# Patient Record
Sex: Female | Born: 1950 | Race: Black or African American | Hispanic: No | Smoking: Current every day smoker
Health system: Southern US, Community
[De-identification: ages and names within clinical notes are randomized; demographics above are authoritative.]

## PROBLEM LIST (undated history)

## (undated) DIAGNOSIS — D171 Benign lipomatous neoplasm of skin and subcutaneous tissue of trunk: Secondary | ICD-10-CM

## (undated) DIAGNOSIS — F32A Depression, unspecified: Secondary | ICD-10-CM

## (undated) DIAGNOSIS — F329 Major depressive disorder, single episode, unspecified: Secondary | ICD-10-CM

## (undated) DIAGNOSIS — J432 Centrilobular emphysema: Secondary | ICD-10-CM

## (undated) DIAGNOSIS — I10 Essential (primary) hypertension: Secondary | ICD-10-CM

## (undated) DIAGNOSIS — K219 Gastro-esophageal reflux disease without esophagitis: Secondary | ICD-10-CM

## (undated) DIAGNOSIS — F419 Anxiety disorder, unspecified: Secondary | ICD-10-CM

## (undated) DIAGNOSIS — E785 Hyperlipidemia, unspecified: Secondary | ICD-10-CM

## (undated) DIAGNOSIS — R002 Palpitations: Secondary | ICD-10-CM

## (undated) DIAGNOSIS — E119 Type 2 diabetes mellitus without complications: Secondary | ICD-10-CM

## (undated) HISTORY — DX: Essential (primary) hypertension: I10

## (undated) HISTORY — PX: COLONOSCOPY: SHX174

## (undated) HISTORY — DX: Gastro-esophageal reflux disease without esophagitis: K21.9

## (undated) HISTORY — PX: CARDIAC CATHETERIZATION: SHX172

## (undated) HISTORY — DX: Hyperlipidemia, unspecified: E78.5

---

## 2003-01-27 ENCOUNTER — Other Ambulatory Visit: Payer: Self-pay

## 2004-08-31 ENCOUNTER — Ambulatory Visit: Payer: Self-pay | Admitting: Internal Medicine

## 2004-10-24 ENCOUNTER — Ambulatory Visit: Payer: Self-pay | Admitting: Gastroenterology

## 2005-04-23 ENCOUNTER — Emergency Department: Payer: Self-pay | Admitting: Emergency Medicine

## 2005-04-23 ENCOUNTER — Other Ambulatory Visit: Payer: Self-pay

## 2005-08-09 ENCOUNTER — Ambulatory Visit: Payer: Self-pay | Admitting: Family Medicine

## 2006-01-07 ENCOUNTER — Other Ambulatory Visit: Payer: Self-pay

## 2006-01-07 ENCOUNTER — Inpatient Hospital Stay: Payer: Self-pay | Admitting: Internal Medicine

## 2006-04-18 ENCOUNTER — Emergency Department: Payer: Self-pay | Admitting: Emergency Medicine

## 2006-09-01 ENCOUNTER — Other Ambulatory Visit: Payer: Self-pay

## 2006-09-01 ENCOUNTER — Emergency Department: Payer: Self-pay | Admitting: Internal Medicine

## 2007-02-01 ENCOUNTER — Emergency Department: Payer: Self-pay | Admitting: Emergency Medicine

## 2007-06-04 ENCOUNTER — Ambulatory Visit: Payer: Self-pay

## 2008-06-01 ENCOUNTER — Emergency Department: Payer: Self-pay | Admitting: Emergency Medicine

## 2009-03-07 ENCOUNTER — Emergency Department: Payer: Self-pay | Admitting: Emergency Medicine

## 2012-02-17 ENCOUNTER — Emergency Department: Payer: Self-pay | Admitting: Emergency Medicine

## 2012-02-17 LAB — CBC
HCT: 39.5 % (ref 35.0–47.0)
HGB: 13.2 g/dL (ref 12.0–16.0)
MCH: 31.6 pg (ref 26.0–34.0)
MCHC: 33.4 g/dL (ref 32.0–36.0)
WBC: 7.7 10*3/uL (ref 3.6–11.0)

## 2012-02-17 LAB — TROPONIN I: Troponin-I: <0.02 ng/mL

## 2012-02-17 LAB — COMPREHENSIVE METABOLIC PANEL
Albumin: 3.8 g/dL (ref 3.4–5.0)
Alkaline Phosphatase: 122 U/L (ref 50–136)
Anion Gap: 10 (ref 7–16)
BUN: 19 mg/dL — ABNORMAL HIGH (ref 7–18)
Bilirubin,Total: 0.3 mg/dL (ref 0.2–1.0)
Co2: 26 mmol/L (ref 21–32)
Creatinine: 0.81 mg/dL (ref 0.60–1.30)
EGFR (Non-African Amer.): >60
Glucose: 124 mg/dL — ABNORMAL HIGH (ref 65–99)
Osmolality: 283 (ref 275–301)
Potassium: 3.6 mmol/L (ref 3.5–5.1)

## 2012-02-17 LAB — PRO B NATRIURETIC PEPTIDE: B-Type Natriuretic Peptide: 29 pg/mL (ref 0–125)

## 2012-12-20 ENCOUNTER — Emergency Department: Payer: Self-pay | Admitting: Emergency Medicine

## 2015-03-02 ENCOUNTER — Emergency Department: Payer: Self-pay

## 2015-03-02 ENCOUNTER — Encounter: Payer: Self-pay | Admitting: Emergency Medicine

## 2015-03-02 ENCOUNTER — Emergency Department
Admission: EM | Admit: 2015-03-02 | Discharge: 2015-03-02 | Disposition: A | Discharge status: Home / Self Care | Payer: Self-pay | Attending: Emergency Medicine | Admitting: Emergency Medicine

## 2015-03-02 DIAGNOSIS — R519 Headache, unspecified: Secondary | ICD-10-CM

## 2015-03-02 DIAGNOSIS — F17200 Nicotine dependence, unspecified, uncomplicated: Secondary | ICD-10-CM | POA: Insufficient documentation

## 2015-03-02 DIAGNOSIS — R42 Dizziness and giddiness: Secondary | ICD-10-CM

## 2015-03-02 DIAGNOSIS — I10 Essential (primary) hypertension: Secondary | ICD-10-CM | POA: Insufficient documentation

## 2015-03-02 DIAGNOSIS — R110 Nausea: Secondary | ICD-10-CM

## 2015-03-02 DIAGNOSIS — R51 Headache: Secondary | ICD-10-CM

## 2015-03-02 DIAGNOSIS — E876 Hypokalemia: Secondary | ICD-10-CM | POA: Insufficient documentation

## 2015-03-02 LAB — BASIC METABOLIC PANEL
ANION GAP: 7 (ref 5–15)
BUN: 11 mg/dL (ref 6–20)
CHLORIDE: 105 mmol/L (ref 101–111)
CO2: 31 mmol/L (ref 22–32)
CREATININE: 0.87 mg/dL (ref 0.44–1.00)
Calcium: 8.9 mg/dL (ref 8.9–10.3)
GFR calc non Af Amer: >60 mL/min (ref >60)
Glucose, Bld: 102 mg/dL — ABNORMAL HIGH (ref 65–99)
Potassium: 3.1 mmol/L — ABNORMAL LOW (ref 3.5–5.1)
Sodium: 143 mmol/L (ref 135–145)

## 2015-03-02 LAB — CBC
HEMATOCRIT: 39.2 % (ref 35.0–47.0)
HEMOGLOBIN: 13.3 g/dL (ref 12.0–16.0)
MCH: 32.1 pg (ref 26.0–34.0)
MCHC: 34 g/dL (ref 32.0–36.0)
MCV: 94.7 fL (ref 80.0–100.0)
Platelets: 169 10*3/uL (ref 150–440)
RBC: 4.14 MIL/uL (ref 3.80–5.20)
RDW: 13.8 % (ref 11.5–14.5)
WBC: 7.9 10*3/uL (ref 3.6–11.0)

## 2015-03-02 MED ORDER — POTASSIUM CHLORIDE CRYS ER 20 MEQ PO TBCR
40.0000 meq | EXTENDED_RELEASE_TABLET | Freq: Once | ORAL | Status: AC
Start: 2015-03-02 — End: 2015-03-02
  Administered 2015-03-02: 40 meq via ORAL
  Filled 2015-03-02: qty 2

## 2015-03-02 MED ORDER — HYDROCHLOROTHIAZIDE 12.5 MG PO TABS: 25.0000 mg | ORAL_TABLET | Freq: Every day | ORAL | Status: DC

## 2015-03-02 MED ORDER — ONDANSETRON HCL 4 MG/2ML IJ SOLN
4.0000 mg | Freq: Once | INTRAMUSCULAR | Status: AC
  Administered 2015-03-02: 4 mg via INTRAVENOUS
  Filled 2015-03-02: qty 2

## 2015-03-02 MED ORDER — LABETALOL HCL 5 MG/ML IV SOLN
10.0000 mg | Freq: Once | INTRAVENOUS | Status: AC
  Administered 2015-03-02: 10 mg via INTRAVENOUS
  Filled 2015-03-02: qty 4

## 2015-03-02 NOTE — ED Notes (Signed)
Pt here for dizziness, headache and nausea.

## 2015-03-02 NOTE — Discharge Instructions (Signed)
Please take the blood pressure medication every day in the morning. Please check your blood pressure approximate 1 hour after you take the medication, and during a calm part of your day. Record your blood pressure and bring it with you to your follow-up appointment.  Please make an appointment to establish a primary care physician at the [facility] clinic or at any primary care clinic of your choice.  Return to the emergency department if he develops severe headache, nausea or vomiting, visual or speech changes, confusion, numbness tingling or weakness, difficulty walking, chest pain or shortness of breath, or any other symptoms concerning to you.

## 2015-03-02 NOTE — ED Notes (Signed)
Pt being transported to CT at this time, will start IV and administer meds once pt returns from CT. Pt and family made aware, verbalized understanding at this time

## 2015-03-02 NOTE — ED Notes (Signed)
Pt given urine cup, instructed on proper use, verbalized to let RN know once able to provide sample.

## 2015-03-02 NOTE — ED Provider Notes (Signed)
[facility] Emergency Department Provider Note  ____________________________________________  Time seen: Approximately 5:43 PM  I have reviewed the triage vital signs and the nursing notes.   HISTORY  Chief Complaint Dizziness; Nausea; and Hypertension    HPI Tami Sanders is a 65 y.o. female with a history of untreated hypertension due to medication noncompliance brought by her daughter for mild headache, postural lightheadedness, and nausea without vomiting. Patient reports that she sat last saw a physician greater than 5 years ago but that she does not take her prescribed antihypertensives. Over the past 2 days, she has had a mild frontal headache associated with lightheadedness when she's "stands up too fast" and nausea without vomiting. She is still tolerating her normal amount of food and drink. She does not have any fever, cough or cold symptoms, chest pain, pressure or tightness, palpitations, numbness tingling or weakness.   No past medical history on file.  There are no active problems to display for this patient.   No past surgical history on file.  Current Outpatient Rx  Name  Route  Sig  Dispense  Refill  . hydrochlorothiazide (HYDRODIURIL) 12.5 MG tablet   Oral   Take 2 tablets (25 mg total) by mouth daily.   30 tablet   0     Allergies Review of patient's allergies indicates no known allergies.  No family history on file.  Social History Social History  Substance Use Topics  . Smoking status: Current Every Day Smoker  . Smokeless tobacco: None  . Alcohol Use: None    Review of Systems Constitutional: No fever/chills. + Postural Lightheadedness, no syncope. Eyes: No visual changes. No blurred or double vision. ENT: No sore throat. Cardiovascular: Denies chest pain, palpitations. Respiratory: Denies shortness of breath.  No cough. Gastrointestinal: No abdominal pain.  Positive nausea, no vomiting.  No diarrhea.  No  constipation. Genitourinary: Negative for dysuria. Musculoskeletal: Negative for back pain. Skin: Negative for rash. Neurological: Positive mild headaches without focal weakness or numbness. The tingling. No difficulty walking. No visual or speech changes. No changes in mental status.  10-point ROS otherwise negative.  ____________________________________________   PHYSICAL EXAM:  VITAL SIGNS: ED Triage Vitals  Enc Vitals Group     BP 03/02/15 1654 237/108 mmHg     Pulse Rate 03/02/15 1654 80     Resp 03/02/15 1654 16     Temp 03/02/15 1654 98.5 F (36.9 C)     Temp Source 03/02/15 1654 Oral     SpO2 03/02/15 1654 100 %     Weight --      Height --      Head Cir --      Peak Flow --      Pain Score 03/02/15 1654 3     Pain Loc --      Pain Edu? --      Excl. in [place]? --     Constitutional: Alert and oriented. Well appearing and in no acute distress. Answer question appropriately. Eyes: Conjunctivae are normal.  EOMI. PERRLA.  Head: Atraumatic. Nose: No congestion/rhinnorhea. Mouth/Throat: Mucous membranes are moist.  Neck: No stridor.  Supple.  No JVD Cardiovascular: Normal rate, regular rhythm. No murmurs, rubs or gallops.  Respiratory: Normal respiratory effort.  No retractions. Lungs CTAB.  No wheezes, rales or ronchi. Gastrointestinal: Soft and nontender. No distention. No peritoneal signs. Musculoskeletal: No LE edema. No palpable cords or tenderness to palpation in the calves. Negative Homans sign. Neurologic:  Alert and  oriented 3. Speech is clear. Face and smile are symmetric. EOMI and PERRLA. No pronator drift. Moves all extremity's well. Normal gait without ataxia. Skin:  Skin is warm, dry and intact. No rash noted. Psychiatric: Mood and affect are normal. Speech and behavior are normal.  Normal judgement.  ____________________________________________   LABS (all labs ordered are listed, but only abnormal results are displayed)  Labs Reviewed  BASIC  METABOLIC PANEL - Abnormal; Notable for the following:    Potassium 3.1 (*)    Glucose, Bld 102 (*)    All other components within normal limits  CBC  URINALYSIS COMPLETEWITH MICROSCOPIC ([facility] ONLY)   ____________________________________________  EKG  ED ECG REPORT I, Eula Listen, the attending physician, personally viewed and interpreted this ECG.   Date: 03/02/2015  EKG Time: 1659  Rate: 72  Rhythm: normal sinus rhythm  Axis: Normal  Intervals:none  ST&T Change: No ST elevation. No ischemic changes.  ____________________________________________  RADIOLOGY  Ct Head Wo Contrast  03/02/2015  CLINICAL DATA:  Pt. C/o dizziness and nausea with HTN x2 days. No known injuries. No hx of CA or surg. EXAM: CT HEAD WITHOUT CONTRAST TECHNIQUE: Contiguous axial images were obtained from the base of the skull through the vertex without intravenous contrast. COMPARISON:  03/07/2009 FINDINGS: The ventricles are normal in size and configuration. There are no parenchymal masses or mass effect, no evidence of an infarct, no extra-axial masses or abnormal fluid collections and no intracranial hemorrhage. No skull lesion. Visualized sinuses and mastoid air cells are clear. IMPRESSION: Normal unenhanced CT scan the brain. Electronically Signed   By: Lajean Manes M.D.   On: 03/02/2015 18:07    ____________________________________________   PROCEDURES  Procedure(s) performed: None  Critical Care performed: No ____________________________________________   INITIAL IMPRESSION / ASSESSMENT AND PLAN / ED COURSE  Pertinent labs & imaging results that were available during my care of the patient were reviewed by me and considered in my medical decision making (see chart for details).  65 y.o. female with untreated HTN presenting w/ 2d of mild HA, postural lightheaded and nausea w/o vomiting w/ BP 237/108 on arrival.  Patient has no focal findings consistent with acute CVA on exam. She may  have symptomatic hypertension, so I will attempt to lower her blood pressure and reevaluate for improvement of symptoms. We'll also get a CT scan to rule out stroke although this is less likely. We will also check her kidney function and she has had many years of untreated hypertension. If her workup in the emergency department does not use any acute emergency, will plan to discharge her home with antihypertensives and close PMD follow-up.  ----------------------------------------- 6:33 PM on 03/02/2015 -----------------------------------------  The patient's repeat blood pressure is 191/78. Her headache is improving and her nausea is gone. She is tolerating liquid by mouth. Her labs are reassuring with a normal creatinine and normal blood count she also has a CT of the head which does not show any acute process. She is unable to give urine at this time. I'll plan to discharge her home with instructions to follow up at the [place] clinic, or the [facility] where she's been seen before. She understands return precautions as well as follow-up instructions. ____________________________________________  FINAL CLINICAL IMPRESSION(S) / ED DIAGNOSES  Final diagnoses:  Essential hypertension  Acute nonintractable headache, unspecified headache type  Lightheadedness  Nausea without vomiting  Hypokalemia      NEW MEDICATIONS STARTED DURING THIS VISIT:  New Prescriptions   HYDROCHLOROTHIAZIDE (  HYDRODIURIL) 12.5 MG TABLET    Take 2 tablets (25 mg total) by mouth daily.     Eula Listen, MD 03/02/15 [phone]

## 2016-07-30 ENCOUNTER — Emergency Department: Payer: Medicare HMO

## 2016-07-30 ENCOUNTER — Emergency Department
Admission: EM | Admit: 2016-07-30 | Discharge: 2016-07-30 | Disposition: A | Discharge status: Home / Self Care | Payer: Medicare HMO | Attending: Emergency Medicine | Admitting: Emergency Medicine

## 2016-07-30 DIAGNOSIS — E876 Hypokalemia: Secondary | ICD-10-CM

## 2016-07-30 DIAGNOSIS — R079 Chest pain, unspecified: Secondary | ICD-10-CM | POA: Diagnosis not present

## 2016-07-30 DIAGNOSIS — Z79899 Other long term (current) drug therapy: Secondary | ICD-10-CM | POA: Diagnosis not present

## 2016-07-30 DIAGNOSIS — I10 Essential (primary) hypertension: Secondary | ICD-10-CM | POA: Diagnosis not present

## 2016-07-30 DIAGNOSIS — F17210 Nicotine dependence, cigarettes, uncomplicated: Secondary | ICD-10-CM | POA: Diagnosis not present

## 2016-07-30 LAB — BASIC METABOLIC PANEL
Anion gap: 8 (ref 5–15)
BUN: 19 mg/dL (ref 6–20)
CALCIUM: 9.5 mg/dL (ref 8.9–10.3)
CO2: 29 mmol/L (ref 22–32)
CREATININE: 0.99 mg/dL (ref 0.44–1.00)
Chloride: 103 mmol/L (ref 101–111)
GFR calc non Af Amer: 59 mL/min — ABNORMAL LOW (ref >60)
GLUCOSE: 152 mg/dL — AB (ref 65–99)
Potassium: 3.2 mmol/L — ABNORMAL LOW (ref 3.5–5.1)
Sodium: 140 mmol/L (ref 135–145)

## 2016-07-30 LAB — CBC
HCT: 41.4 % (ref 35.0–47.0)
Hemoglobin: 13.9 g/dL (ref 12.0–16.0)
MCH: 31.4 pg (ref 26.0–34.0)
MCHC: 33.7 g/dL (ref 32.0–36.0)
MCV: 93.4 fL (ref 80.0–100.0)
PLATELETS: 229 10*3/uL (ref 150–440)
RBC: 4.43 MIL/uL (ref 3.80–5.20)
RDW: 14.3 % (ref 11.5–14.5)
WBC: 8.4 10*3/uL (ref 3.6–11.0)

## 2016-07-30 LAB — TROPONIN I: Troponin I: <0.03 ng/mL (ref <0.03)

## 2016-07-30 MED ORDER — POTASSIUM CHLORIDE CRYS ER 20 MEQ PO TBCR
40.0000 meq | EXTENDED_RELEASE_TABLET | Freq: Once | ORAL | Status: AC
  Administered 2016-07-30: 40 meq via ORAL
  Filled 2016-07-30: qty 2

## 2016-07-30 NOTE — ED Triage Notes (Signed)
Pt c/o substernal chest pain/tightness since yesterday with increased SOB.. Pt is in NAD at present. Respirations WNL.. Skin is warm and dry.Marland Kitchen

## 2016-07-30 NOTE — ED Notes (Signed)
Dr. Lord at bedside at this time.  

## 2016-07-30 NOTE — ED Provider Notes (Signed)
[facility] Emergency Department Provider Note ____________________________________________   I have reviewed the triage vital signs and the triage nursing note.  HISTORY  Chief Complaint Chest Pain   Historian Patient  HPI Tami Sanders is a 66 y.o. female presents here with her daughter, reports feeling of off and on her chest for about 3 or 4 days. She describes it as a mild central chest discomfort, mild shortness of breath. No nausea or sweats. No dizziness or passing out. No focal weakness or numbness.  She is a smoker. She is diagnosed with high blood pressure and follows with Dr. Lennox Grumbles at the [place] clinic.  No new medication or medication changes recently.  No exertional component has been constant for 3 or 4 days. Symptoms are overall mild. Next the next States that she had a cardiac catheterization about 2 years ago and was told there was no blockages.      History reviewed. No pertinent past medical history.  There are no active problems to display for this patient.   History reviewed. No pertinent surgical history.  Prior to Admission medications   Medication Sig Start Date End Date Taking? Authorizing Provider  hydrochlorothiazide (HYDRODIURIL) 12.5 MG tablet Take 2 tablets (25 mg total) by mouth daily. 03/02/15   Eula Listen, MD    No Known Allergies  No family history on file.  Social History Social History  Substance Use Topics  . Smoking status: Current Every Day Smoker  . Smokeless tobacco: Never Used  . Alcohol use No    Review of Systems  Constitutional: Negative for fever. Eyes: Negative for visual changes. ENT: Negative for sore throat. Cardiovascular: Positive for mild central chest discomfort. Respiratory: Positive for mild shortness of breath, no pleuritic chest discomfort. Gastrointestinal: Negative for abdominal pain, vomiting and diarrhea. Genitourinary: Negative for dysuria. Musculoskeletal:  Negative for back pain. Skin: Negative for rash. Neurological: Negative for headache.  ____________________________________________   PHYSICAL EXAM:  VITAL SIGNS: ED Triage Vitals  Enc Vitals Group     BP 07/30/16 1611 (!) 166/81     Pulse Rate 07/30/16 1611 (!) 102     Resp 07/30/16 1611 20     Temp 07/30/16 1611 98.8 F (37.1 C)     Temp Source 07/30/16 1611 Oral     SpO2 07/30/16 1611 96 %     Weight 07/30/16 1612 200 lb (90.7 kg)     Height 07/30/16 1612 5\' 6"  (1.676 m)     Head Circumference --      Peak Flow --      Pain Score 07/30/16 1608 7     Pain Loc --      Pain Edu? --      Excl. in [place]? --      Constitutional: Alert and oriented. Well appearing and in no distress. HEENT   Head: Normocephalic and atraumatic.      Eyes: Conjunctivae are normal. Pupils equal and round.       Ears:         Nose: No congestion/rhinnorhea.   Mouth/Throat: Mucous membranes are moist.   Neck: No stridor. Cardiovascular/Chest: Normal rate, regular rhythm.  No murmurs, rubs, or gallops. Respiratory: Normal respiratory effort without tachypnea nor retractions. Breath sounds are clear and equal bilaterally. No wheezes/rales/rhonchi. Gastrointestinal: Soft. No distention, no guarding, no rebound. Nontender.  Obese  Genitourinary/rectal:Deferred Musculoskeletal: Nontender with normal range of motion in all extremities. No joint effusions.  No lower extremity tenderness.  No edema. Neurologic:  Normal speech and language. No gross or focal neurologic deficits are appreciated. Skin:  Skin is warm, dry and intact. No rash noted. Psychiatric: Mood and affect are normal. Speech and behavior are normal. Patient exhibits appropriate insight and judgment.   ____________________________________________  LABS (pertinent positives/negatives)  Labs Reviewed  BASIC METABOLIC PANEL - Abnormal; Notable for the following:       Result Value   Potassium 3.2 (*)    Glucose, Bld 152 (*)     GFR calc non Af Amer 59 (*)    All other components within normal limits  CBC  TROPONIN I    ____________________________________________    EKG I, Lisa Roca, MD, the attending physician have personally viewed and interpreted all ECGs.  107 bpm. Sinus tachycardia. Narrow QRS. Normal axis.  Nonspecific ST and T-wave. ____________________________________________  RADIOLOGY All Xrays were viewed by me. Imaging interpreted by Radiologist.  Chest x-ray two-view:  IMPRESSION: No radiographic evidence of acute cardiopulmonary disease __________________________________________  PROCEDURES  Procedure(s) performed: None  Critical Care performed: None  ____________________________________________   ED COURSE / ASSESSMENT AND PLAN  Pertinent labs & imaging results that were available during my care of the patient were reviewed by me and considered in my medical decision making (see chart for details).   This was overall as well as a she's been complaining of some central chest pressure or mild experiences shortness of breath for several days now. It is not exertional. It overall mild.  EKG is without acute ischemic findings.  Laboratory studies are reassuring with negative troponin. Chest x-ray is clear. She's not reporting infectious symptoms. Symptoms do not seem consistent with PE.  She does have a long history of smoking, and I have recommended that she follow up with Dr. Raul Del with pulmonology to consider pulmonary function testing.  In terms of the chest discomfort sensation, I am recommending that she have close follow up with a cardiologist, and her daughter will call for her tomorrow.    CONSULTATIONS:  None   Patient / Family / Caregiver informed of clinical course, medical decision-making process, and agree with plan.   I discussed return precautions, follow-up instructions, and discharge instructions with patient and/or family.  Discharge Instructions :  You were evaluated for chest pressure, and although no certain cause was found, your exam and evaluation are reassuring in the emergency department today.   Return to the emergency department immediately for any worsening symptoms including chest pain, nausea, sweats, dizziness or passing out, weakness, numbness, trouble breathing, or any other symptoms concerning to you.   ___________________________________________   FINAL CLINICAL IMPRESSION(S) / ED DIAGNOSES   Final diagnoses:  Nonspecific chest pain  Hypokalemia              Note: This dictation was prepared with Dragon dictation. Any transcriptional errors that result from this process are unintentional    Lisa Roca, MD 07/30/16 [id number]

## 2016-07-30 NOTE — Discharge Instructions (Signed)
You were evaluated for chest pressure, and although no certain cause was found, your exam and evaluation are reassuring in the emergency department today.   Return to the emergency department immediately for any worsening symptoms including chest pain, nausea, sweats, dizziness or passing out, weakness, numbness, trouble breathing, or any other symptoms concerning to you.

## 2016-07-30 NOTE — ED Notes (Signed)
NAD noted at time of D/C. Pt denies questions or concerns. Pt ambulatory to the lobby at this time.  

## 2016-07-31 ENCOUNTER — Encounter: Payer: Self-pay | Admitting: *Deleted

## 2016-07-31 ENCOUNTER — Telehealth: Payer: Self-pay | Admitting: Cardiovascular Disease

## 2016-07-31 NOTE — Telephone Encounter (Signed)
Spoke with patient and she states that she is no longer having chest pain and is just tired. Confirmed her scheduled appointment for 09/20/16 with Dr. Rockey Situ. Instructed her to go back to ED if she should develop any more chest pain associated with shortness of breath, nausea, or vomiting. She verbalized understanding with no further questions at this time.

## 2016-07-31 NOTE — Telephone Encounter (Signed)
Spoke to patient about making ED fu appointment She is currently scheduled for 09/20/16 to see Dr Rockey Situ  Please advise is patient needs to be seen sooner and if so where may she be placed.

## 2016-08-08 ENCOUNTER — Inpatient Hospital Stay: Payer: Self-pay

## 2016-08-08 ENCOUNTER — Ambulatory Visit (INDEPENDENT_AMBULATORY_CARE_PROVIDER_SITE_OTHER): Payer: Medicare HMO | Admitting: General Surgery

## 2016-08-08 ENCOUNTER — Encounter: Payer: Self-pay | Admitting: General Surgery

## 2016-08-08 ENCOUNTER — Encounter: Payer: Self-pay | Admitting: *Deleted

## 2016-08-08 VITALS — BP 142/80 | HR 82 | Resp 12 | Ht 66.0 in | Wt 200.0 lb

## 2016-08-08 DIAGNOSIS — Z803 Family history of malignant neoplasm of breast: Secondary | ICD-10-CM

## 2016-08-08 DIAGNOSIS — Z1239 Encounter for other screening for malignant neoplasm of breast: Secondary | ICD-10-CM

## 2016-08-08 DIAGNOSIS — R222 Localized swelling, mass and lump, trunk: Secondary | ICD-10-CM | POA: Diagnosis not present

## 2016-08-08 DIAGNOSIS — Z1231 Encounter for screening mammogram for malignant neoplasm of breast: Secondary | ICD-10-CM | POA: Diagnosis not present

## 2016-08-08 NOTE — Patient Instructions (Addendum)
Schedule lipoma excision procedure Schedule mammogram to be completed prior to procedure  Lipoma Removal Lipoma removal is a surgical procedure to remove a noncancerous (benign) tumor that is made up of fat cells (lipoma). Most lipomas are small and painless and do not require treatment. They can form in many areas of the body but are most common under the skin of the back, shoulders, arms, and thighs. You may need lipoma removal if you have a lipoma that is large, growing, or causing discomfort. Lipoma removal may also be done for cosmetic reasons. Tell a health care provider about:  Any allergies you have.  All medicines you are taking, including vitamins, herbs, eye drops, creams, and over-the-counter medicines.  Any problems you or family members have had with anesthetic medicines.  Any blood disorders you have.  Any surgeries you have had.  Any medical conditions you have.  Whether you are pregnant or may be pregnant. What are the risks? Generally, this is a safe procedure. However, problems may occur, including:  Infection.  Bleeding.  Allergic reactions to medicines.  Damage to nerves or blood vessels near the lipoma.  Scarring.  What happens before the procedure? Staying hydrated Follow instructions from your health care provider about hydration, which may include:  Up to 2 hours before the procedure - you may continue to drink clear liquids, such as water, clear fruit juice, black coffee, and plain tea.  Eating and drinking restrictions Follow instructions from your health care provider about eating and drinking, which may include:  8 hours before the procedure - stop eating heavy meals or foods such as meat, fried foods, or fatty foods.  6 hours before the procedure - stop eating light meals or foods, such as toast or cereal.  6 hours before the procedure - stop drinking milk or drinks that contain milk.  2 hours before the procedure - stop drinking clear  liquids.  Medicines  Ask your health care provider about: ? Changing or stopping your regular medicines. This is especially important if you are taking diabetes medicines or blood thinners. ? Taking medicines such as aspirin and ibuprofen. These medicines can thin your blood. Do not take these medicines before your procedure if your health care provider instructs you not to.  You may be given antibiotic medicine to help prevent infection. General instructions  Ask your health care provider how your surgical site will be marked or identified.  You will have a physical exam. Your health care provider will check the size of the lipoma and whether it can be moved easily.  You may have imaging tests, such as: ? X-rays. ? CT scan. ? MRI.  Plan to have someone take you home from the hospital or clinic. What happens during the procedure?  To reduce your risk of infection: ? Your health care team will wash or sanitize their hands. ? Your skin will be washed with soap.  You will be given one or more of the following: ? A medicine to help you relax (sedative). ? A medicine to numb the area (local anesthetic). ? A medicine to make you fall asleep (general anesthetic). ? A medicine that is injected into an area of your body to numb everything below the injection site (regional anesthetic).  An incision will be made over the lipoma or very near the lipoma. The incision may be made in a natural skin line or crease.  Tissues, nerves, and blood vessels near the lipoma will be moved out of the  way.  The lipoma and the capsule that surrounds it will be separated from the surrounding tissues.  The lipoma will be removed.  The incision may be closed with stitches (sutures).  A bandage (dressing) will be placed over the incision. What happens after the procedure?  Do not drive for 24 hours if you received a sedative.  Your blood pressure, heart rate, breathing rate, and blood oxygen level  will be monitored until the medicines you were given have worn off. This information is not intended to replace advice given to you by your health care provider. Make sure you discuss any questions you have with your health care provider. Document Released: 03/03/2015 Document Revised: 05/26/2015 Document Reviewed: 03/03/2015 Elsevier Interactive Patient Education  [employer].

## 2016-08-08 NOTE — Progress Notes (Signed)
Patient ID: Tami Sanders, female   DOB: 1950/04/16, 66 y.o.   MRN: [id number]  Chief Complaint  Patient presents with  . Other    Chest wall mass    HPI Tami Sanders is a 66 y.o. female is here today for an evaluation for a chest wall mass. She states the mass has been present for one year. Has not noticed an increase in size. Sore at times, only hurts when she lays on it. Denies redness/discharge. The mass is located on her right lateral chest wall. HPI  Past Medical History:  Diagnosis Date  . GERD (gastroesophageal reflux disease)   . Hypertension     No past surgical history on file.  Family History  Problem Relation Age of Onset  . Cancer Mother        breast  . Cancer Father        prostate  . Cancer Paternal Aunt         breast    Social History Social History  Substance Use Topics  . Smoking status: Current Every Day Smoker  . Smokeless tobacco: Never Used  . Alcohol use No    No Known Allergies  Current Outpatient Prescriptions  Medication Sig Dispense Refill  . amLODipine (NORVASC) 10 MG tablet Take 10 mg by mouth daily.    Marland Kitchen atorvastatin (LIPITOR) 10 MG tablet Take 10 mg by mouth daily.    . hydrALAZINE (APRESOLINE) 10 MG tablet Take 10 mg by mouth 3 (three) times daily.    . hydrochlorothiazide (HYDRODIURIL) 12.5 MG tablet Take 2 tablets (25 mg total) by mouth daily. 30 tablet 0  . lisinopril (PRINIVIL,ZESTRIL) 10 MG tablet Take 10 mg by mouth daily.    . nitroGLYCERIN (NITROSTAT) 0.3 MG SL tablet Place 0.3 mg under the tongue every 5 (five) minutes as needed for chest pain.    Marland Kitchen omeprazole (PRILOSEC) 10 MG capsule Take 10 mg by mouth daily.    . traZODone (DESYREL) 100 MG tablet Take 100 mg by mouth at bedtime.     No current facility-administered medications for this visit.     Review of Systems Review of Systems  Constitutional: Negative.   Respiratory: Negative.   Cardiovascular: Negative.     Blood pressure (!) 142/80, pulse 82, resp.  rate 12, height 5\' 6"  (1.676 m), weight 200 lb (90.7 kg).  Physical Exam Physical Exam  Constitutional: She is oriented to person, place, and time. She appears well-developed and well-nourished.  Eyes: Conjunctivae are normal. No scleral icterus.  Cardiovascular: Normal rate, regular rhythm and normal heart sounds.   Pulmonary/Chest: Effort normal and breath sounds normal. Right breast exhibits no inverted nipple, no mass, no nipple discharge, no skin change and no tenderness. Left breast exhibits no inverted nipple, no mass, no nipple discharge, no skin change and no tenderness.  Abdominal: Soft. Bowel sounds are normal.    Lymphadenopathy:    She has no cervical adenopathy.    She has no axillary adenopathy.  Neurological: She is alert and oriented to person, place, and time.  Skin: Skin is warm and dry.  Psychiatric: She has a normal mood and affect.    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 Recommend removal because of location and propensity to cause pain  Option of excision in office or in SDS discussed.  Pt prefers some sort of sedation/anesthesia Family hx of breast cancer - pt is at increased risk for breast cancer, due for mammogram, last mammogram was "years ago."     Plan    Patient be be scheduled for lipoma excision as an outpatient surgery.  Will arrange for a mammogram prior to excision.      HPI, Physical Exam, Assessment and Plan have been scribed under the direction and in the presence of Mckinley Jewel, MD.  Verlene Mayer, CMA I have completed the exam and reviewed the above documentation for accuracy and completeness.  I agree with the above.  Haematologist has been used and any errors in dictation or transcription are unintentional.  Kyjuan Gause G. Jamal Collin, M.D., F.A.C.S.   Junie Panning G 08/08/2016, 11:24  AM

## 2016-08-08 NOTE — Progress Notes (Signed)
Patient is scheduled for a bilateral screening mammogram at [facility] on 08-21-16 at 3:40 pm.   This patient wishes to wait until she sees Dr. Rockey Situ on 09-20-16 before she schedules the excision of the mass from right lateral chest wall.   Patient will call the office when she is ready to proceed.

## 2016-08-21 ENCOUNTER — Ambulatory Visit
Admission: RE | Admit: 2016-08-21 | Discharge: 2016-08-21 | Disposition: A | Discharge status: Home / Self Care | Payer: Medicare HMO | Source: Ambulatory Visit | Attending: General Surgery | Admitting: General Surgery

## 2016-08-21 DIAGNOSIS — R928 Other abnormal and inconclusive findings on diagnostic imaging of breast: Secondary | ICD-10-CM | POA: Insufficient documentation

## 2016-08-21 DIAGNOSIS — Z1231 Encounter for screening mammogram for malignant neoplasm of breast: Secondary | ICD-10-CM | POA: Insufficient documentation

## 2016-08-21 DIAGNOSIS — Z1239 Encounter for other screening for malignant neoplasm of breast: Secondary | ICD-10-CM

## 2016-08-23 ENCOUNTER — Other Ambulatory Visit: Payer: Self-pay | Admitting: General Surgery

## 2016-08-23 DIAGNOSIS — N6310 Unspecified lump in the right breast, unspecified quadrant: Secondary | ICD-10-CM

## 2016-08-23 DIAGNOSIS — R928 Other abnormal and inconclusive findings on diagnostic imaging of breast: Secondary | ICD-10-CM

## 2016-09-11 ENCOUNTER — Ambulatory Visit
Admission: RE | Admit: 2016-09-11 | Discharge: 2016-09-11 | Disposition: A | Discharge status: Home / Self Care | Payer: Medicare HMO | Source: Ambulatory Visit | Attending: General Surgery | Admitting: General Surgery

## 2016-09-11 DIAGNOSIS — R928 Other abnormal and inconclusive findings on diagnostic imaging of breast: Secondary | ICD-10-CM

## 2016-09-11 DIAGNOSIS — N6310 Unspecified lump in the right breast, unspecified quadrant: Secondary | ICD-10-CM

## 2016-09-19 NOTE — Progress Notes (Addendum)
Cardiology Office Note  Date:  09/20/2016   ID:  Tami Sanders, DOB [birth year], MRN [id number]  PCP:  Tami Merles, MD   Chief Complaint  Patient presents with  . other    Pt. was at [facility] ER with chest pain. Pt. c/o chest pain, rapid pounding heart beats and shortness of breath. Meds reviewd by the pt. verbally.      HPI:  Tami Sanders is a 66 y.o. female  With history of smoking HTN Obesity Prior episodes of chest pain mild central chest discomfort,  mild shortness of breath.  cardiac catheterization 2013 She presents for follow-up of chest pain, shortness of breath, palpitations  Discussed previous records with her, request has been placed to receive cardiac catheterization from [facility] 2013. She reports at that time nose no significant coronary disease   No nausea or sweats. No dizziness or passing out. No focal weakness or numbness.  smoker. Continues to smoke, no desire to quit  Reports blood pressure typically 130 to 140 follows with Dr. Lennox Sanders at the [place] [facility].  chest wall mass right lateral chest wall, scheduled to have lipoma resected  She reports having periodic  Strong  heart beats,  seems to run fast,  lasting 15 to 20 min Worse when hot, such as over the summer  Gradually now getting better, as the weather is improving Not a major issue at this time  Stress test 2013 [facility] Cardiac cath 2013: "normal"  Recently seen in the emergency room for chest pain, Hospital records reviewed with the patient in detail Atypical in nature, workup negative  EKG personally reviewed by myself on todays visit Shows normal sinus rhythm with rate 78 bpm nonspecific T wave abnormality, rare PVCs  Lab work reviewed showing total cholesterol 208, LDL 130, normal LFTs, creatinine 0.8, glucose 109 him a hemoglobin A1c 6.4   PMH:   has a past medical history of GERD (gastroesophageal reflux disease); Hyperlipidemia; and Hypertension.  PSH:   History reviewed. No pertinent  surgical history.  Current Outpatient Prescriptions  Medication Sig Dispense Refill  . amLODipine (NORVASC) 10 MG tablet Take 10 mg by mouth daily.    . hydrALAZINE (APRESOLINE) 10 MG tablet Take 10 mg by mouth 3 (three) times daily.    . hydrochlorothiazide (HYDRODIURIL) 12.5 MG tablet Take 2 tablets (25 mg total) by mouth daily. 30 tablet 0  . lisinopril (PRINIVIL,ZESTRIL) 10 MG tablet Take 10 mg by mouth daily.    . nitroGLYCERIN (NITROSTAT) 0.3 MG SL tablet Place 0.3 mg under the tongue every 5 (five) minutes as needed for chest pain.    Marland Kitchen omeprazole (PRILOSEC) 10 MG capsule Take 10 mg by mouth daily.    . traZODone (DESYREL) 100 MG tablet Take 100 mg by mouth at bedtime.    . potassium chloride (K-DUR) 10 MEQ tablet Take 1 tablet (10 mEq total) by mouth daily. 30 tablet 11   No current facility-administered medications for this visit.      Allergies:   Patient has no known allergies.   Social History:  The patient  reports that she has been smoking Cigarettes.  She has a 21.00 pack-year smoking history. She has never used smokeless tobacco. She reports that she does not drink alcohol or use drugs.   Family History:   family history includes Breast cancer in her mother and paternal aunt; Cancer in her father, mother, and paternal aunt.    Review of Systems: Review of Systems  Constitutional: Negative.  Respiratory: Negative.   Cardiovascular: Positive for chest pain and palpitations.  Gastrointestinal: Negative.   Musculoskeletal: Negative.   Neurological: Negative.   Psychiatric/Behavioral: Negative.   All other systems reviewed and are negative.    PHYSICAL EXAM: VS:  BP (!) 150/82 (BP Location: Right Arm, Patient Position: Sitting, Cuff Size: Large)   Pulse 89   Ht 5' 6.5" (1.689 m)   Wt 199 lb 12 oz (90.6 kg)   BMI 31.76 kg/m  , BMI Body mass index is 31.76 kg/m. GEN: Well nourished, well developed, in no acute distress, obese  HEENT: normal  Neck: no JVD,  carotid bruits, or masses Cardiac: RRR; no murmurs, rubs, or gallops,no edema  Respiratory:  clear to auscultation bilaterally, normal work of breathing GI: soft, nontender, nondistended, + BS MS: no deformity or atrophy  Skin: warm and dry, no rash Neuro:  Strength and sensation are intact Psych: euthymic mood, full affect    Recent Labs: 07/30/2016: BUN 19; Creatinine, Ser 0.99; Hemoglobin 13.9; Platelets 229; Potassium 3.2; Sodium 140    Lipid Panel No results found for: CHOL, HDL, LDLCALC, TRIG    Wt Readings from Last 3 Encounters:  09/20/16 199 lb 12 oz (90.6 kg)  08/08/16 200 lb (90.7 kg)  07/30/16 200 lb (90.7 kg)       ASSESSMENT AND PLAN:  Chest pain, unspecified type - Plan: EKG 12-Lead Atypical in nature, currently feels well No active anginal symptoms We have requested previous cardiac catheterization results Palpitations Exacerbated by the heat of the summer, improved with the cooler weather Suggested if symptoms recur and she is symptomatic, we could try low-dose beta blocker His symptoms persist, we could start metoprolol  Centrilobular emphysema ([place]) Stressed importance of smoking cessation Chronic stable mild shortness of breath Also likely deconditioned  Essential hypertension Blood pressure is well controlled on today's visit. No changes made to the medications. Recommended she monitor blood pressure at home If blood pressure continues to run high we could increase hydralazine or lisinopril  Smoker Long discussion concerning need for smoking cessation We have encouraged her to continue to work on weaning her cigarettes and smoking cessation. She will continue to work on this and does not want any assistance with chantix.   Disposition:   F/U  12 months  Previous records reviewed, long discussion with patient and family in the room with her today  Total encounter time more than 60 minutes  Greater than 50% was spent in counseling and  coordination of care with the patient    Orders Placed This Encounter  Procedures  . EKG 12-Lead     Signed, Tami Sanders, M.D., Ph.D. 09/20/2016  [place], [place]

## 2016-09-20 ENCOUNTER — Ambulatory Visit (INDEPENDENT_AMBULATORY_CARE_PROVIDER_SITE_OTHER): Payer: Medicare HMO | Admitting: Cardiovascular Disease

## 2016-09-20 ENCOUNTER — Encounter: Payer: Self-pay | Admitting: Cardiovascular Disease

## 2016-09-20 VITALS — BP 150/82 | HR 89 | Ht 66.5 in | Wt 199.8 lb

## 2016-09-20 DIAGNOSIS — I10 Essential (primary) hypertension: Secondary | ICD-10-CM | POA: Diagnosis not present

## 2016-09-20 DIAGNOSIS — J432 Centrilobular emphysema: Secondary | ICD-10-CM | POA: Diagnosis not present

## 2016-09-20 DIAGNOSIS — R002 Palpitations: Secondary | ICD-10-CM | POA: Diagnosis not present

## 2016-09-20 DIAGNOSIS — R079 Chest pain, unspecified: Secondary | ICD-10-CM

## 2016-09-20 DIAGNOSIS — F17200 Nicotine dependence, unspecified, uncomplicated: Secondary | ICD-10-CM

## 2016-09-20 MED ORDER — POTASSIUM CHLORIDE ER 10 MEQ PO TBCR: 10.0000 meq | EXTENDED_RELEASE_TABLET | Freq: Every day | ORAL | 11 refills | Status: DC

## 2016-09-20 NOTE — Patient Instructions (Addendum)
Medication Instructions:   Please take potassium daily for low potassium Think about adding magnesium  Labwork:  No new labs needed  Testing/Procedures:  No further testing at this time  Please call if you need a heart monitor   Follow-Up: It was a pleasure seeing you in the office today. Please call us if you have new issues that need to be addressed before your next appt.  [phone]  Your physician wants you to follow-up in: 12 months as needed.  You will receive a reminder letter in the mail two months in advance. If you don't receive a letter, please call our office to schedule the follow-up appointment.  If you need a refill on your cardiac medications before your next appointment, please call your pharmacy.

## 2016-09-26 ENCOUNTER — Telehealth: Payer: Self-pay | Admitting: *Deleted

## 2016-09-26 NOTE — Telephone Encounter (Signed)
They need cardiac clearance for patients upcoming procedure. We just saw her last week. Will route to Dr. Rockey Situ for review.

## 2016-09-26 NOTE — Telephone Encounter (Signed)
-----   Message from Dominga Ferry, [place] sent at 09/26/2016 10:56 AM EDT ----- Patient states she is cleared for surgery by Dr. Rockey Situ. I see that she was recently seen by him but I don't see where it states clearance. Can you please document in EPIC if cleared for surgery and let me know once complete? Patient needs to have a right lateral chest wall mass excision completed at the outpatient setting at [facility] with Dr. Jamal Collin. Thanks.

## 2016-09-27 NOTE — Telephone Encounter (Signed)
Note routed over to Michele Bailey CMA. 

## 2016-09-27 NOTE — Telephone Encounter (Signed)
Spoke with the patient's daughter today and surgery arranged for 10-09-16 with Dr. Jamal Collin.   Cardiac clearance faxed to the Pre-admission Testing department.

## 2016-09-27 NOTE — Telephone Encounter (Signed)
Acceptable risk for surgery, no further testing needed Cardiac catheterization reviewed from August 2013 with no coronary disease

## 2016-09-28 ENCOUNTER — Other Ambulatory Visit: Payer: Self-pay | Admitting: General Surgery

## 2016-09-28 DIAGNOSIS — R222 Localized swelling, mass and lump, trunk: Secondary | ICD-10-CM

## 2016-10-02 ENCOUNTER — Inpatient Hospital Stay
Admission: RE | Admit: 2016-10-02 | Discharge: 2016-10-02 | Disposition: A | Discharge status: Home / Self Care | Payer: Medicare HMO | Source: Ambulatory Visit

## 2016-10-02 NOTE — Pre-Procedure Instructions (Signed)
Telephone Encounter Encounter Date: 09/26/2016 Minna Merritts, MD  Cardiology    [] Hide copied text [] Hover for attribution information Acceptable risk for surgery, no further testing needed Cardiac catheterization reviewed from August 2013 with no coronary disease    Electronically signed by Minna Merritts, MD at 09/27/2016 3:17 PM

## 2016-10-02 NOTE — Pre-Procedure Instructions (Signed)
Progress Notes Encounter Date: 09/20/2016 Tami Merritts, MD  Cardiology  Expand All Collapse All   [] Hide copied text Cardiology Office Note  Date:  09/20/2016   ID:  TANICIA Sanders, DOB [birth year], MRN [id number]  PCP:  Marguerita Merles, MD            Chief Complaint  Patient presents with  . other    Pt. was at [facility] ER with chest pain. Pt. c/o chest pain, rapid pounding heart beats and shortness of breath. Meds reviewd by the pt. verbally.      HPI:  Tami Tonche Mooreis a 66 y.o.female With history of smoking HTN Obesity Prior episodes of chest pain mild central chest discomfort,  mild shortness of breath.  cardiac catheterization 2013 She presents for follow-up of chest pain, shortness of breath, palpitations  Discussed previous records with her, request has been placed to receive cardiac catheterization from [facility] 2013. She reports at that time nose no significant coronary disease   No nausea or sweats. No dizziness or passing out. No focal weakness or numbness.  smoker. Continues to smoke, no desire to quit  Reports blood pressure typically 130 to 140 follows with Dr. Lennox Grumbles at the [place] clinic.  chest wall mass right lateral chest wall, scheduled to have lipoma resected  She reports having periodic  Strong  heart beats,  seems to run fast,  lasting 15 to 20 min Worse when hot, such as over the summer  Gradually now getting better, as the weather is improving Not a major issue at this time  Stress test 2013 [facility] Cardiac cath 2013: "normal"  Recently seen in the emergency room for chest pain, Hospital records reviewed with the patient in detail Atypical in nature, workup negative  EKG personally reviewed by myself on todays visit Shows normal sinus rhythm with rate 78 bpm nonspecific T wave abnormality, rare PVCs  Lab work reviewed showing total cholesterol 208, LDL 130, normal LFTs, creatinine 0.8, glucose 109 him a hemoglobin A1c  6.4   PMH:   has a past medical history of GERD (gastroesophageal reflux disease); Hyperlipidemia; and Hypertension.  PSH:   History reviewed. No pertinent surgical history.        Current Outpatient Prescriptions  Medication Sig Dispense Refill  . amLODipine (NORVASC) 10 MG tablet Take 10 mg by mouth daily.    . hydrALAZINE (APRESOLINE) 10 MG tablet Take 10 mg by mouth 3 (three) times daily.    . hydrochlorothiazide (HYDRODIURIL) 12.5 MG tablet Take 2 tablets (25 mg total) by mouth daily. 30 tablet 0  . lisinopril (PRINIVIL,ZESTRIL) 10 MG tablet Take 10 mg by mouth daily.    . nitroGLYCERIN (NITROSTAT) 0.3 MG SL tablet Place 0.3 mg under the tongue every 5 (five) minutes as needed for chest pain.    Marland Kitchen omeprazole (PRILOSEC) 10 MG capsule Take 10 mg by mouth daily.    . traZODone (DESYREL) 100 MG tablet Take 100 mg by mouth at bedtime.    . potassium chloride (K-DUR) 10 MEQ tablet Take 1 tablet (10 mEq total) by mouth daily. 30 tablet 11   No current facility-administered medications for this visit.      Allergies:   Patient has no known allergies.   Social History:  The patient  reports that she has been smoking Cigarettes.  She has a 21.00 pack-year smoking history. She has never used smokeless tobacco. She reports that she does not drink alcohol or use drugs.   Family History:  family history includes Breast cancer in her mother and paternal aunt; Cancer in her father, mother, and paternal aunt.    Review of Systems: Review of Systems  Constitutional: Negative.   Respiratory: Negative.   Cardiovascular: Positive for chest pain and palpitations.  Gastrointestinal: Negative.   Musculoskeletal: Negative.   Neurological: Negative.   Psychiatric/Behavioral: Negative.   All other systems reviewed and are negative.    PHYSICAL EXAM: VS:  BP (!) 150/82 (BP Location: Right Arm, Patient Position: Sitting, Cuff Size: Large)   Pulse 89   Ht 5' 6.5"  (1.689 m)   Wt 199 lb 12 oz (90.6 kg)   BMI 31.76 kg/m  , BMI Body mass index is 31.76 kg/m. GEN: Well nourished, well developed, in no acute distress, obese  HEENT: normal  Neck: no JVD, carotid bruits, or masses Cardiac: RRR; no murmurs, rubs, or gallops,no edema  Respiratory:  clear to auscultation bilaterally, normal work of breathing GI: soft, nontender, nondistended, + BS MS: no deformity or atrophy  Skin: warm and dry, no rash Neuro:  Strength and sensation are intact Psych: euthymic mood, full affect    Recent Labs: 07/30/2016: BUN 19; Creatinine, Ser 0.99; Hemoglobin 13.9; Platelets 229; Potassium 3.2; Sodium 140    Lipid Panel RecentLabs  No results found for: CHOL, HDL, LDLCALC, TRIG         Wt Readings from Last 3 Encounters:  09/20/16 199 lb 12 oz (90.6 kg)  08/08/16 200 lb (90.7 kg)  07/30/16 200 lb (90.7 kg)       ASSESSMENT AND PLAN:  Chest pain, unspecified type - Plan: EKG 12-Lead Atypical in nature, currently feels well No active anginal symptoms We have requested previous cardiac catheterization results Palpitations Exacerbated by the heat of the summer, improved with the cooler weather Suggested if symptoms recur and she is symptomatic, we could try low-dose beta blocker His symptoms persist, we could start metoprolol  Centrilobular emphysema ([place]) Stressed importance of smoking cessation Chronic stable mild shortness of breath Also likely deconditioned  Essential hypertension Blood pressure is well controlled on today's visit. No changes made to the medications. Recommended she monitor blood pressure at home If blood pressure continues to run high we could increase hydralazine or lisinopril  Smoker Long discussion concerning need for smoking cessation We have encouraged her to continue to work on weaning her cigarettes and smoking cessation. She will continue to work on this and does not want any assistance with chantix.    Disposition:   F/U  12 months  Previous records reviewed, long discussion with patient and family in the room with her today  Total encounter time more than 60 minutes  Greater than 50% was spent in counseling and coordination of care with the patient       Orders Placed This Encounter  Procedures  . EKG 12-Lead     Signed, Esmond Plants, M.D., Ph.D. 09/20/2016  [employer]     Electronically signed by Tami Merritts, MD at 09/20/2016 2:04 PM Electronically signed by Tami Merritts, MD at 09/20/2016 2:05 PM      Office Visit on 09/20/2016        Revision History        Detailed Report

## 2016-10-03 ENCOUNTER — Encounter
Admission: RE | Admit: 2016-10-03 | Discharge: 2016-10-03 | Disposition: A | Discharge status: Home / Self Care | Payer: Medicare HMO | Source: Ambulatory Visit | Attending: General Surgery | Admitting: General Surgery

## 2016-10-03 DIAGNOSIS — Z01812 Encounter for preprocedural laboratory examination: Secondary | ICD-10-CM | POA: Insufficient documentation

## 2016-10-03 HISTORY — DX: Major depressive disorder, single episode, unspecified: F32.9

## 2016-10-03 HISTORY — DX: Depression, unspecified: F32.A

## 2016-10-03 HISTORY — DX: Anxiety disorder, unspecified: F41.9

## 2016-10-03 NOTE — Patient Instructions (Addendum)
Your procedure is scheduled on: 10-09-16 TUESDAY Report to Same Day Surgery 2nd floor medical mall [facility] Entrance-take elevator on left to 2nd floor.  Check in with surgery information desk.) To find out your arrival time please call [phone] between 1PM - 3PM on 10-08-16 MONDAY  Remember: Instructions that are not followed completely may result in serious medical risk, up to and including death, or upon the discretion of your surgeon and anesthesiologist your surgery may need to be rescheduled.    _x___ 1. Do not eat food after midnight the night before your procedure. NO GUM CHEWING OR HARD CANDIES.  You may drink clear liquids up to 2 hours before you are scheduled to arrive at the hospital for your procedure.  Do not drink clear liquids within 2 hours of your scheduled arrival to the hospital.  Clear liquids include  --Water or Apple juice without pulp  --Clear carbohydrate beverage such as ClearFast or Gatorade  --Black Coffee or Clear Tea (No milk, no creamers, do not add anything to the coffee or Tea)  Type 1 and type 2 diabetics should only drink water.      __x__ 2. No Alcohol for 24 hours before or after surgery.   __x__3. No Smoking for 24 prior to surgery.   ____  4. Bring all medications with you on the day of surgery if instructed.    __x__ 5. Notify your doctor if there is any change in your medical condition     (cold, fever, infections).     Do not wear jewelry, make-up, hairpins, clips or nail polish.  Do not wear lotions, powders, or perfumes. You may wear deodorant.  Do not shave 48 hours prior to surgery. Men may shave face and neck.  Do not bring valuables to the hospital.    [facility] is not responsible for any belongings or valuables.               Contacts, dentures or bridgework may not be worn into surgery.  Leave your suitcase in the car. After surgery it may be brought to your room.  For patients admitted to the hospital, discharge time is  determined by your treatment team.   Patients discharged the day of surgery will not be allowed to drive home.  You will need someone to drive you home and stay with you the night of your procedure.    Please read over the following fact sheets that you were given:   [facility] Preparing for Surgery and or MRSA Information   _x___ TAKE THE FOLLOWING MEDICATIONS THE MORNING OF SURGERY WITH A SMALL SIP OF WATER. These include:  1. AMLODIPINE (NORVASC)  2. HYDRALAZINE  3. PRILOSEC (OMEPRAZOLE)  4. TAKE AN EXTRA PRILOSEC ON Monday NIGHT BEFORE BED (10-08-16)  5.  6.  ____Fleets enema or Magnesium Citrate as directed.   _x___ Use CHG Soap or sage wipes as directed on instruction sheet   ____ Use inhalers on the day of surgery and bring to hospital day of surgery  ____ Stop Metformin and Janumet 2 days prior to surgery.    ____ Take 1/2 of usual insulin dose the night before surgery and none on the morning surgery.   ____ Follow recommendations from Cardiologist, Pulmonologist or PCP regarding stopping Aspirin, Coumadin, Plavix ,Eliquis, Effient, or Pradaxa, and Pletal.  ____Stop Anti-inflammatories such as Advil, Aleve, Ibuprofen, Motrin, Naproxen, Naprosyn, Goodies powders or aspirin products. OK to take Tylenol    ____ Stop supplements  until after surgery.    ____ Bring C-Pap to the hospital.

## 2016-10-04 ENCOUNTER — Encounter
Admission: RE | Admit: 2016-10-04 | Discharge: 2016-10-04 | Disposition: A | Discharge status: Home / Self Care | Payer: Medicare HMO | Source: Ambulatory Visit | Attending: General Surgery | Admitting: General Surgery

## 2016-10-04 ENCOUNTER — Telehealth: Payer: Self-pay | Admitting: *Deleted

## 2016-10-04 DIAGNOSIS — Z01812 Encounter for preprocedural laboratory examination: Secondary | ICD-10-CM | POA: Diagnosis present

## 2016-10-04 LAB — BASIC METABOLIC PANEL
Anion gap: 12 (ref 5–15)
BUN: 18 mg/dL (ref 6–20)
CALCIUM: 8.9 mg/dL (ref 8.9–10.3)
CHLORIDE: 100 mmol/L — AB (ref 101–111)
CO2: 26 mmol/L (ref 22–32)
CREATININE: 0.79 mg/dL (ref 0.44–1.00)
GFR calc non Af Amer: >60 mL/min (ref >60)
Glucose, Bld: 134 mg/dL — ABNORMAL HIGH (ref 65–99)
Potassium: 2.8 mmol/L — ABNORMAL LOW (ref 3.5–5.1)
SODIUM: 138 mmol/L (ref 135–145)

## 2016-10-04 NOTE — Telephone Encounter (Signed)
Patient contacted today since lab work showed potassium was low. She is currently taking 20 meq of potassium daily.  Dr. Jamal Collin notified.   Patient was instructed to take 20 meq of potassium twice daily until surgery. Labs to be rechecked day of procedure.   The patient verbalizes understanding.

## 2016-10-08 NOTE — Pre-Procedure Instructions (Signed)
CARDIAC CLEARANCE IN EPIC FROM DR Rockey Situ

## 2016-10-09 ENCOUNTER — Ambulatory Visit
Admission: RE | Admit: 2016-10-09 | Discharge: 2016-10-09 | Disposition: A | Discharge status: Home / Self Care | Payer: Medicare HMO | Source: Ambulatory Visit | Attending: General Surgery | Admitting: General Surgery

## 2016-10-09 ENCOUNTER — Encounter: Payer: Self-pay | Admitting: General Surgery

## 2016-10-09 ENCOUNTER — Ambulatory Visit: Payer: Medicare HMO | Admitting: Certified Registered"

## 2016-10-09 ENCOUNTER — Encounter
Admission: RE | Disposition: A | Discharge status: Home / Self Care | Payer: Self-pay | Source: Ambulatory Visit | Attending: General Surgery

## 2016-10-09 DIAGNOSIS — F329 Major depressive disorder, single episode, unspecified: Secondary | ICD-10-CM | POA: Insufficient documentation

## 2016-10-09 DIAGNOSIS — F17210 Nicotine dependence, cigarettes, uncomplicated: Secondary | ICD-10-CM | POA: Diagnosis not present

## 2016-10-09 DIAGNOSIS — D171 Benign lipomatous neoplasm of skin and subcutaneous tissue of trunk: Secondary | ICD-10-CM | POA: Insufficient documentation

## 2016-10-09 DIAGNOSIS — J449 Chronic obstructive pulmonary disease, unspecified: Secondary | ICD-10-CM | POA: Insufficient documentation

## 2016-10-09 DIAGNOSIS — I10 Essential (primary) hypertension: Secondary | ICD-10-CM | POA: Diagnosis not present

## 2016-10-09 DIAGNOSIS — F419 Anxiety disorder, unspecified: Secondary | ICD-10-CM | POA: Insufficient documentation

## 2016-10-09 DIAGNOSIS — Z6831 Body mass index (BMI) 31.0-31.9, adult: Secondary | ICD-10-CM | POA: Insufficient documentation

## 2016-10-09 DIAGNOSIS — Z79899 Other long term (current) drug therapy: Secondary | ICD-10-CM | POA: Insufficient documentation

## 2016-10-09 DIAGNOSIS — R222 Localized swelling, mass and lump, trunk: Secondary | ICD-10-CM

## 2016-10-09 DIAGNOSIS — D213 Benign neoplasm of connective and other soft tissue of thorax: Secondary | ICD-10-CM | POA: Diagnosis not present

## 2016-10-09 DIAGNOSIS — K219 Gastro-esophageal reflux disease without esophagitis: Secondary | ICD-10-CM | POA: Insufficient documentation

## 2016-10-09 DIAGNOSIS — E669 Obesity, unspecified: Secondary | ICD-10-CM | POA: Diagnosis not present

## 2016-10-09 DIAGNOSIS — E785 Hyperlipidemia, unspecified: Secondary | ICD-10-CM | POA: Insufficient documentation

## 2016-10-09 HISTORY — PX: MASS EXCISION: SHX2000

## 2016-10-09 LAB — POCT I-STAT 4, (NA,K, GLUC, HGB,HCT)
Glucose, Bld: 127 mg/dL — ABNORMAL HIGH (ref 65–99)
HCT: 40 % (ref 36.0–46.0)
Hemoglobin: 13.6 g/dL (ref 12.0–15.0)
Potassium: 3.3 mmol/L — ABNORMAL LOW (ref 3.5–5.1)
SODIUM: 141 mmol/L (ref 135–145)

## 2016-10-09 SURGERY — EXCISION MASS
Anesthesia: General | Laterality: Right | Wound class: Clean

## 2016-10-09 MED ORDER — FENTANYL CITRATE (PF) 100 MCG/2ML IJ SOLN
INTRAMUSCULAR | Status: AC
  Filled 2016-10-09: qty 2

## 2016-10-09 MED ORDER — LIDOCAINE HCL (PF) 1 % IJ SOLN
INTRAMUSCULAR | Status: DC | PRN
  Administered 2016-10-09: 22.5 mL

## 2016-10-09 MED ORDER — ONDANSETRON HCL 4 MG/2ML IJ SOLN
INTRAMUSCULAR | Status: DC | PRN
  Administered 2016-10-09: 4 mg via INTRAVENOUS

## 2016-10-09 MED ORDER — PROMETHAZINE HCL 25 MG/ML IJ SOLN: 6.2500 mg | INTRAMUSCULAR | Status: DC | PRN

## 2016-10-09 MED ORDER — DEXAMETHASONE SODIUM PHOSPHATE 10 MG/ML IJ SOLN
INTRAMUSCULAR | Status: DC | PRN
  Administered 2016-10-09: 10 mg via INTRAVENOUS

## 2016-10-09 MED ORDER — BUPIVACAINE HCL (PF) 0.5 % IJ SOLN
INTRAMUSCULAR | Status: AC
  Filled 2016-10-09: qty 30

## 2016-10-09 MED ORDER — MEPERIDINE HCL 50 MG/ML IJ SOLN: 6.2500 mg | INTRAMUSCULAR | Status: DC | PRN

## 2016-10-09 MED ORDER — BUPIVACAINE HCL (PF) 0.5 % IJ SOLN
INTRAMUSCULAR | Status: DC | PRN
  Administered 2016-10-09: 22.5 mL

## 2016-10-09 MED ORDER — OXYCODONE HCL 5 MG/5ML PO SOLN: 5.0000 mg | Freq: Once | ORAL | Status: DC | PRN

## 2016-10-09 MED ORDER — OXYCODONE HCL 5 MG PO TABS: 5.0000 mg | ORAL_TABLET | Freq: Once | ORAL | Status: DC | PRN

## 2016-10-09 MED ORDER — MIDAZOLAM HCL 2 MG/2ML IJ SOLN
INTRAMUSCULAR | Status: DC | PRN
  Administered 2016-10-09 (×2): 1 mg via INTRAVENOUS

## 2016-10-09 MED ORDER — ONDANSETRON HCL 4 MG/2ML IJ SOLN
INTRAMUSCULAR | Status: AC
  Filled 2016-10-09: qty 2

## 2016-10-09 MED ORDER — TRAMADOL HCL 50 MG PO TABS: 50.0000 mg | ORAL_TABLET | Freq: Four times a day (QID) | ORAL | 0 refills | Status: DC | PRN

## 2016-10-09 MED ORDER — FENTANYL CITRATE (PF) 100 MCG/2ML IJ SOLN
INTRAMUSCULAR | Status: DC | PRN
Start: 2016-10-09 — End: 2016-10-09
  Administered 2016-10-09 (×4): 25 ug via INTRAVENOUS

## 2016-10-09 MED ORDER — DEXAMETHASONE SODIUM PHOSPHATE 10 MG/ML IJ SOLN
INTRAMUSCULAR | Status: AC
  Filled 2016-10-09: qty 1

## 2016-10-09 MED ORDER — MIDAZOLAM HCL 2 MG/2ML IJ SOLN
INTRAMUSCULAR | Status: AC
  Filled 2016-10-09: qty 2

## 2016-10-09 MED ORDER — CHLORHEXIDINE GLUCONATE CLOTH 2 % EX PADS: 6.0000 | MEDICATED_PAD | Freq: Once | CUTANEOUS | Status: DC

## 2016-10-09 MED ORDER — FENTANYL CITRATE (PF) 100 MCG/2ML IJ SOLN: 25.0000 ug | INTRAMUSCULAR | Status: DC | PRN

## 2016-10-09 MED ORDER — PROPOFOL 500 MG/50ML IV EMUL
INTRAVENOUS | Status: DC | PRN
  Administered 2016-10-09: 140 ug/kg/min via INTRAVENOUS

## 2016-10-09 MED ORDER — PROPOFOL 10 MG/ML IV BOLUS
INTRAVENOUS | Status: DC | PRN
  Administered 2016-10-09: 40 mg via INTRAVENOUS

## 2016-10-09 MED ORDER — LACTATED RINGERS IV SOLN
INTRAVENOUS | Status: DC
  Administered 2016-10-09: 11:00:00 via INTRAVENOUS

## 2016-10-09 SURGICAL SUPPLY — 21 items
BLADE SURG 15 STRL SS SAFETY (BLADE) ×3 IMPLANT
CANISTER SUCT 1200ML W/VALVE (MISCELLANEOUS) ×3 IMPLANT
CHLORAPREP W/TINT 26ML (MISCELLANEOUS) ×3 IMPLANT
DERMABOND ADVANCED (GAUZE/BANDAGES/DRESSINGS) ×2
DERMABOND ADVANCED .7 DNX12 (GAUZE/BANDAGES/DRESSINGS) ×1 IMPLANT
DRAPE LAPAROTOMY T 102X78X121 (DRAPES) ×3 IMPLANT
ELECT REM PT RETURN 9FT ADLT (ELECTROSURGICAL) ×3
ELECTRODE REM PT RTRN 9FT ADLT (ELECTROSURGICAL) ×1 IMPLANT
GLOVE BIO SURGEON STRL SZ7 (GLOVE) ×3 IMPLANT
GOWN STRL REUS W/ TWL LRG LVL3 (GOWN DISPOSABLE) ×2 IMPLANT
GOWN STRL REUS W/TWL LRG LVL3 (GOWN DISPOSABLE) ×4
KIT RM TURNOVER STRD PROC AR (KITS) ×3 IMPLANT
LABEL OR SOLS (LABEL) ×3 IMPLANT
NDL SAFETY 22GX1.5 (NEEDLE) ×3 IMPLANT
PACK BASIN MINOR ARMC (MISCELLANEOUS) ×3 IMPLANT
SUT MNCRL AB 4-0 PS2 18 (SUTURE) ×3 IMPLANT
SUT VIC AB 2-0 SH 27 (SUTURE) ×2
SUT VIC AB 2-0 SH 27XBRD (SUTURE) ×1 IMPLANT
SUT VIC AB 3-0 SH 27 (SUTURE) ×2
SUT VIC AB 3-0 SH 27X BRD (SUTURE) ×1 IMPLANT
SYRINGE 10CC LL (SYRINGE) ×3 IMPLANT

## 2016-10-09 NOTE — Anesthesia Preprocedure Evaluation (Signed)
Anesthesia Evaluation  Patient identified by MRN, date of birth, ID band Patient awake    Reviewed: Allergy & Precautions, NPO status , Patient's Chart, lab work & pertinent test results  History of Anesthesia Complications Negative for: history of anesthetic complications  Airway Mallampati: II   Neck ROM: Full    Dental  (+) Upper Dentures, Lower Dentures   Pulmonary neg sleep apnea, COPD, Current Smoker,    breath sounds clear to auscultation- rhonchi (-) wheezing      Cardiovascular hypertension, Pt. on medications (-) CAD, (-) Past MI and (-) Cardiac Stents  Rhythm:Regular Rate:Normal - Systolic murmurs and - Diastolic murmurs    Neuro/Psych PSYCHIATRIC DISORDERS Anxiety Depression negative neurological ROS     GI/Hepatic Neg liver ROS, GERD  ,  Endo/Other  negative endocrine ROSneg diabetes  Renal/GU negative Renal ROS     Musculoskeletal negative musculoskeletal ROS (+)   Abdominal (+) + obese,   Peds  Hematology negative hematology ROS (+)   Anesthesia Other Findings Past Medical History: No date: Anxiety No date: Depression No date: GERD (gastroesophageal reflux disease) No date: Hyperlipidemia No date: Hypertension   Reproductive/Obstetrics                             Anesthesia Physical Anesthesia Plan  ASA: II  Anesthesia Plan: General   Post-op Pain Management:    Induction: Intravenous  PONV Risk Score and Plan: 1 and Propofol infusion  Airway Management Planned: Natural Airway  Additional Equipment:   Intra-op Plan:   Post-operative Plan:   Informed Consent: I have reviewed the patients History and Physical, chart, labs and discussed the procedure including the risks, benefits and alternatives for the proposed anesthesia with the patient or authorized representative who has indicated his/her understanding and acceptance.   Dental advisory given  Plan  Discussed with: CRNA and Anesthesiologist  Anesthesia Plan Comments:         Anesthesia Quick Evaluation

## 2016-10-09 NOTE — H&P (Signed)
Tami Sanders is an 66 y.o. female.   Chief Complaint:Mass right chest wall  HPI: This 66 year old female who is here for elective excision of a subcutaneous mass likely a lipoma on the right lateral chest wall . she's had this for approximately a year and it seems to be bothersome especially when she is lying down on that side or any type of garments that go over this after discussion with the patient was decided to fix remove this with local anesthetic and sedation  Past Medical History:  Diagnosis Date  . Anxiety   . Depression   . GERD (gastroesophageal reflux disease)   . Hyperlipidemia   . Hypertension     Past Surgical History:  Procedure Laterality Date  . CARDIAC CATHETERIZATION    . COLONOSCOPY      Family History  Problem Relation Age of Onset  . Cancer Mother        breast  . Breast cancer Mother   . Cancer Father        prostate  . Cancer Paternal Aunt         breast  . Breast cancer Paternal Aunt    Social History:  reports that she has been smoking Cigarettes.  She has a 21.00 pack-year smoking history. She has never used smokeless tobacco. She reports that she does not drink alcohol or use drugs.  Allergies: No Known Allergies  Medications Prior to Admission  Medication Sig Dispense Refill  . amLODipine (NORVASC) 10 MG tablet Take 10 mg by mouth every morning.     . hydrALAZINE (APRESOLINE) 10 MG tablet Take 10 mg by mouth every morning.     . hydrochlorothiazide (HYDRODIURIL) 25 MG tablet Take 25 mg by mouth daily.    Marland Kitchen lisinopril (PRINIVIL,ZESTRIL) 10 MG tablet Take 10 mg by mouth daily.    . nitroGLYCERIN (NITROSTAT) 0.3 MG SL tablet Place 0.3 mg under the tongue every 5 (five) minutes as needed for chest pain.    Marland Kitchen omeprazole (PRILOSEC) 10 MG capsule Take 10 mg by mouth every morning.     . potassium chloride (KLOR-CON) 20 MEQ packet Take 20 mEq by mouth daily.     . traZODone (DESYREL) 100 MG tablet Take 50 mg by mouth at bedtime as needed for sleep.      . hydrochlorothiazide (HYDRODIURIL) 12.5 MG tablet Take 2 tablets (25 mg total) by mouth daily. (Patient not taking: Reported on 09/28/2016) 30 tablet 0  . ibuprofen (ADVIL,MOTRIN) 200 MG tablet Take 200 mg by mouth every 8 (eight) hours as needed for headache or mild pain.    . potassium chloride (K-DUR) 10 MEQ tablet Take 1 tablet (10 mEq total) by mouth daily. (Patient not taking: Reported on 10/03/2016) 30 tablet 11    Results for orders placed or performed during the hospital encounter of 10/09/16 (from the past 48 hour(s))  I-STAT 4, (NA,K, GLUC, HGB,HCT)     Status: Abnormal   Collection Time: 10/09/16 11:33 AM  Result Value Ref Range   Sodium 141 135 - 145 mmol/L   Potassium 3.3 (L) 3.5 - 5.1 mmol/L   Glucose, Bld 127 (H) 65 - 99 mg/dL   HCT 40.0 36.0 - 46.0 %   Hemoglobin 13.6 12.0 - 15.0 g/dL   No results found.  Review of Systems  Constitutional: Negative.   HENT: Negative.   Respiratory: Negative.   Cardiovascular: Negative.   Gastrointestinal: Negative.   Genitourinary: Negative.     Blood pressure (!) 131/97,  pulse 92, temperature 98.2 F (36.8 C), temperature source Oral, resp. rate 18, height 5' 6.5" (1.689 m), weight 199 lb (90.3 kg), SpO2 100 %. Physical Exam  Constitutional: She is oriented to person, place, and time. She appears well-developed and well-nourished.  Eyes: Conjunctivae are normal. No scleral icterus.  Neck: Neck supple.  Cardiovascular: Normal rate, regular rhythm and normal heart sounds.   Respiratory: Effort normal and breath sounds normal.    GI: Soft. Bowel sounds are normal. There is no tenderness.  Lymphadenopathy:    She has no cervical adenopathy.  Neurological: She is alert and oriented to person, place, and time.  Skin: Skin is warm and dry.     Assessment/Plan Proceed with excision of the lipoma right lateral chest wall as planned  Christene Lye, MD 10/09/2016, 12:07 PM

## 2016-10-09 NOTE — Discharge Instructions (Signed)
AMBULATORY SURGERY  °DISCHARGE INSTRUCTIONS ° ° °1) The drugs that you were given will stay in your system until tomorrow so for the next 24 hours you should not: ° °A) Drive an automobile °B) Make any legal decisions °C) Drink any alcoholic beverage ° ° °2) You may resume regular meals tomorrow.  Today it is better to start with liquids and gradually work up to solid foods. ° °You may eat anything you prefer, but it is better to start with liquids, then soup and crackers, and gradually work up to solid foods. ° ° °3) Please notify your doctor immediately if you have any unusual bleeding, trouble breathing, redness and pain at the surgery site, drainage, fever, or pain not relieved by medication. ° ° ° °4) Additional Instructions: ° ° ° ° ° ° ° °Please contact your physician with any problems or Same Day Surgery at [phone], Monday through Friday 6 am to 4 pm, or [place] [facility] [place] Main number at [phone]. °

## 2016-10-09 NOTE — Transfer of Care (Signed)
Immediate Anesthesia Transfer of Care Note  Patient: Tami Sanders  Procedure(s) Performed: EXCISION MASS RIGHT LATERAL CHEST WALL (Right )  Patient Location: PACU  Anesthesia Type:MAC  Level of Consciousness: patient cooperative  Airway & Oxygen Therapy: Patient Spontanous Breathing  Post-op Assessment: Report given to RN  Post vital signs: Reviewed  Last Vitals:  Vitals:   10/09/16 1102  BP: (!) 131/97  Pulse: 92  Resp: 18  Temp: 36.8 C  SpO2: 100%    Last Pain:  Vitals:   10/09/16 1102  TempSrc: Oral         Complications: No apparent anesthesia complications

## 2016-10-09 NOTE — Anesthesia Post-op Follow-up Note (Signed)
Anesthesia QCDR form completed.        

## 2016-10-09 NOTE — Anesthesia Postprocedure Evaluation (Signed)
Anesthesia Post Note  Patient: Tami Sanders  Procedure(s) Performed: EXCISION MASS RIGHT LATERAL CHEST WALL (Right )  Patient location during evaluation: PACU Anesthesia Type: General Level of consciousness: awake and alert and oriented Pain management: pain level controlled Vital Signs Assessment: post-procedure vital signs reviewed and stable Respiratory status: spontaneous breathing, nonlabored ventilation and respiratory function stable Cardiovascular status: blood pressure returned to baseline and stable Postop Assessment: no signs of nausea or vomiting Anesthetic complications: no     Last Vitals:  Vitals:   10/09/16 1332 10/09/16 1347  BP: 122/79 135/79  Pulse: 62 75  Resp: 11 18  Temp:    SpO2: 99% 99%    Last Pain:  Vitals:   10/09/16 1102  TempSrc: Oral                 Tuff Clabo

## 2016-10-09 NOTE — Op Note (Signed)
Preop diagnosis: Lipoma right posterior lateral chest wall  Post op diagnosis: Same   Operation: Excision lipoma subcutaneous right lateral chest wall  Surgeon: Mckinley Jewel  Assistant:     Anesthesia: Monitored anesthesia care. 45 mL of 0.5% Marcaine mixed with 1% Xylocaine used  Complications: None  EBL: Minimal  Drains: None  Description: Patient was mildly sedated then placed in the left lateral position and held in place with a beanbag. The mass was located in the posterior lateral right lower chest wall and was consistent with a subcutaneous lipoma. This area was prepped and draped sterile field and timeout performed. Local anesthetic was instilled and a skin incision made along the skin crease overlying this mass. Incision was carried down the subcutaneous tissue where a mildly lobulated but well-circumscribed lipoma was found. This was satisfactorily freed from surrounding subcutaneous tissue and the underlying fascia and removed. It measured a little over 4 cm in maximal size. After ensuring hemostasis the Scarpa's fascia was closed with the 3-0 Vicryl. And the skin closed with subcuticular 4-0 Monocryl. Dermabond was applied. Patient tolerated the procedure well with no immediate problems noted. She was then returned recovery room stable condition

## 2016-10-10 LAB — SURGICAL PATHOLOGY

## 2016-10-11 ENCOUNTER — Telehealth: Payer: Self-pay | Admitting: *Deleted

## 2016-10-11 NOTE — Telephone Encounter (Signed)
Notified patient as instructed, patient pleased. Discussed follow-up appointments, patient agrees  

## 2016-10-11 NOTE — Telephone Encounter (Signed)
-----   Message from Christene Lye, MD sent at 10/10/2016  8:23 PM EDT ----- Please inform pt- path is benign lipoma

## 2016-10-16 ENCOUNTER — Ambulatory Visit (INDEPENDENT_AMBULATORY_CARE_PROVIDER_SITE_OTHER): Payer: Medicare HMO | Admitting: General Surgery

## 2016-10-16 ENCOUNTER — Encounter: Payer: Self-pay | Admitting: General Surgery

## 2016-10-16 VITALS — BP 140/70 | HR 72 | Resp 12 | Ht 64.0 in | Wt 197.0 lb

## 2016-10-16 DIAGNOSIS — D171 Benign lipomatous neoplasm of skin and subcutaneous tissue of trunk: Secondary | ICD-10-CM

## 2016-10-16 NOTE — Progress Notes (Signed)
Patient ID: Tami Sanders, female   DOB: [birth year], 66 y.o.   MRN: [id number]  Chief Complaint  Patient presents with  . Routine Post Op    HPI Tami Sanders is a 66 y.o. female here today for her post op chest wall mass excision done on  10/09/2016. Patient states she is doing well. HPI  Past Medical History:  Diagnosis Date  . Anxiety   . Depression   . GERD (gastroesophageal reflux disease)   . Hyperlipidemia   . Hypertension     Past Surgical History:  Procedure Laterality Date  . CARDIAC CATHETERIZATION    . COLONOSCOPY    . MASS EXCISION Right 10/09/2016   Procedure: EXCISION MASS RIGHT LATERAL CHEST WALL;  Surgeon: Christene Lye, MD;  Location: [facility] ORS;  Service: General;  Laterality: Right;    Family History  Problem Relation Age of Onset  . Cancer Mother        breast  . Breast cancer Mother   . Cancer Father        prostate  . Cancer Paternal Aunt         breast  . Breast cancer Paternal Aunt     Social History Social History  Substance Use Topics  . Smoking status: Current Every Day Smoker    Packs/day: 0.50    Years: 42.00    Types: Cigarettes  . Smokeless tobacco: Never Used  . Alcohol use No    No Known Allergies  Current Outpatient Prescriptions  Medication Sig Dispense Refill  . amLODipine (NORVASC) 10 MG tablet Take 10 mg by mouth every morning.     . hydrALAZINE (APRESOLINE) 10 MG tablet Take 10 mg by mouth every morning.     . hydrochlorothiazide (HYDRODIURIL) 12.5 MG tablet Take 2 tablets (25 mg total) by mouth daily. 30 tablet 0  . hydrochlorothiazide (HYDRODIURIL) 25 MG tablet Take 25 mg by mouth daily.    Marland Kitchen ibuprofen (ADVIL,MOTRIN) 200 MG tablet Take 200 mg by mouth every 8 (eight) hours as needed for headache or mild pain.    Marland Kitchen lisinopril (PRINIVIL,ZESTRIL) 10 MG tablet Take 10 mg by mouth daily.    . nitroGLYCERIN (NITROSTAT) 0.3 MG SL tablet Place 0.3 mg under the tongue every 5 (five) minutes as needed for chest pain.     Marland Kitchen omeprazole (PRILOSEC) 10 MG capsule Take 10 mg by mouth every morning.     . potassium chloride (K-DUR) 10 MEQ tablet Take 1 tablet (10 mEq total) by mouth daily. 30 tablet 11  . potassium chloride (KLOR-CON) 20 MEQ packet Take 20 mEq by mouth daily.     . traMADol (ULTRAM) 50 MG tablet Take 1 tablet (50 mg total) by mouth every 6 (six) hours as needed. 10 tablet 0  . traZODone (DESYREL) 100 MG tablet Take 50 mg by mouth at bedtime as needed for sleep.      No current facility-administered medications for this visit.     Review of Systems Review of Systems  Constitutional: Negative.   Respiratory: Negative.   Cardiovascular: Negative.     Blood pressure 140/70, pulse 72, resp. rate 12, height 5\' 4"  (1.626 m), weight 197 lb (89.4 kg).  Physical Exam Physical Exam  Constitutional: She is oriented to person, place, and time. She appears well-developed and well-nourished.  Pulmonary/Chest:      Neurological: She is alert and oriented to person, place, and time.  Skin: Skin is warm and dry.    Data Reviewed  Prior notes and pathology report reviewed-benign lipoma Assessment    Chest wall incision is clean and healing well.     Plan    Pathology diagnosis- Lipoma. Discussed pathology results with patient. Patient to return as needed.The patient is aware to call back for any questions or concerns.  HPI, Physical Exam, Assessment and Plan have been scribed under the direction and in the presence of Mckinley Jewel, MD  Gaspar Cola, CMA     I have completed the exam and reviewed the above documentation for accuracy and completeness.  I agree with the above.  Haematologist has been used and any errors in dictation or transcription are unintentional.  Seeplaputhur G. Jamal Collin, M.D., F.A.C.S.    Junie Panning G 10/18/2016, 9:30 AM

## 2016-10-16 NOTE — Patient Instructions (Signed)
Return as needed

## 2016-10-18 DIAGNOSIS — D171 Benign lipomatous neoplasm of skin and subcutaneous tissue of trunk: Secondary | ICD-10-CM | POA: Insufficient documentation

## 2017-04-05 ENCOUNTER — Encounter: Payer: Self-pay | Admitting: Emergency Medicine

## 2017-04-05 ENCOUNTER — Emergency Department: Payer: Medicare HMO

## 2017-04-05 ENCOUNTER — Emergency Department
Admission: EM | Admit: 2017-04-05 | Discharge: 2017-04-05 | Disposition: A | Discharge status: Home / Self Care | Payer: Medicare HMO | Attending: Emergency Medicine | Admitting: Emergency Medicine

## 2017-04-05 DIAGNOSIS — M79605 Pain in left leg: Secondary | ICD-10-CM | POA: Diagnosis not present

## 2017-04-05 DIAGNOSIS — I10 Essential (primary) hypertension: Secondary | ICD-10-CM | POA: Diagnosis not present

## 2017-04-05 DIAGNOSIS — F17210 Nicotine dependence, cigarettes, uncomplicated: Secondary | ICD-10-CM | POA: Insufficient documentation

## 2017-04-05 DIAGNOSIS — Z79899 Other long term (current) drug therapy: Secondary | ICD-10-CM | POA: Insufficient documentation

## 2017-04-05 DIAGNOSIS — M79652 Pain in left thigh: Secondary | ICD-10-CM | POA: Diagnosis present

## 2017-04-05 MED ORDER — NAPROXEN 375 MG PO TABS: 375.0000 mg | ORAL_TABLET | Freq: Two times a day (BID) | ORAL | 0 refills | Status: DC

## 2017-04-05 NOTE — ED Notes (Signed)
Pt stating that her LLE has been experiencing pain for the last week. Pt stating that her knee gives out on her. Pt stating that pain is in posterior thigh down to her left ankle. Pt stating that it feels "numb but still hurts." Pt did have a fall today when she was getting out of a vehicle.

## 2017-04-05 NOTE — Discharge Instructions (Signed)
Advised elastic knee support.

## 2017-04-05 NOTE — ED Triage Notes (Signed)
Pt reports left thigh pain since Saturday and this am she fell when her knee gave out and now her entire left leg hurts.

## 2017-04-05 NOTE — ED Provider Notes (Signed)
[facility] Emergency Department Provider Note   ____________________________________________   First MD Initiated Contact with Patient 04/05/17 1417     (approximate)  I have reviewed the triage vital signs and the nursing notes.   HISTORY  Chief Complaint Leg Pain    HPI Tami Sanders is a 67 y.o. female patient complain of 6 days of left posterior thigh pain.  Patient also complained of pain to left knee secondary to fall this morning.  Patient denies chest pain or shortness of breath.  Patient rates the pain as a 6/10.  Patient described the pain is "sharp".  No palliative measure for complaint.  Past Medical History:  Diagnosis Date  . Anxiety   . Depression   . GERD (gastroesophageal reflux disease)   . Hyperlipidemia   . Hypertension     Patient Active Problem List   Diagnosis Date Noted  . 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 Chest pain 09/20/2016  . Palpitations 09/20/2016  . Centrilobular emphysema ([place]) 09/20/2016  . Essential hypertension 09/20/2016  . Smoker 09/20/2016    Past Surgical History:  Procedure Laterality Date  . CARDIAC CATHETERIZATION    . COLONOSCOPY    . MASS EXCISION Right 10/09/2016   Procedure: EXCISION MASS RIGHT LATERAL CHEST WALL;  Surgeon: Christene Lye, MD;  Location: [facility] ORS;  Service: General;  Laterality: Right;    Prior to Admission medications   Medication Sig Start Date End Date Taking? Authorizing Provider  amLODipine (NORVASC) 10 MG tablet Take 10 mg by mouth every morning.     [provider]  hydrALAZINE (APRESOLINE) 10 MG tablet Take 10 mg by mouth every morning.     [provider]  hydrochlorothiazide (HYDRODIURIL) 12.5 MG tablet Take 2 tablets (25 mg total) by mouth daily. 03/02/15   Eula Listen, MD  hydrochlorothiazide (HYDRODIURIL) 25 MG tablet Take 25 mg by mouth daily.    [provider]  ibuprofen (ADVIL,MOTRIN) 200 MG tablet Take 200 mg by  mouth every 8 (eight) hours as needed for headache or mild pain.    [provider]  lisinopril (PRINIVIL,ZESTRIL) 10 MG tablet Take 10 mg by mouth daily.    [provider]  naproxen (NAPROSYN) 375 MG tablet Take 1 tablet (375 mg total) by mouth 2 (two) times daily with a meal. 04/05/17   Sable Feil, PA-C  nitroGLYCERIN (NITROSTAT) 0.3 MG SL tablet Place 0.3 mg under the tongue every 5 (five) minutes as needed for chest pain.    [provider]  omeprazole (PRILOSEC) 10 MG capsule Take 10 mg by mouth every morning.     [provider]  potassium chloride (K-DUR) 10 MEQ tablet Take 1 tablet (10 mEq total) by mouth daily. 09/20/16   Minna Merritts, MD  potassium chloride (KLOR-CON) 20 MEQ packet Take 20 mEq by mouth daily.     [provider]  traMADol (ULTRAM) 50 MG tablet Take 1 tablet (50 mg total) by mouth every 6 (six) hours as needed. 10/09/16   Christene Lye, MD  traZODone (DESYREL) 100 MG tablet Take 50 mg by mouth at bedtime as needed for sleep.     [provider]    Allergies Patient has no known allergies.  Family History  Problem Relation Age of Onset  . Cancer Mother        breast  . Breast cancer Mother   . Cancer Father        prostate  .  Cancer Paternal Aunt         breast  . Breast cancer Paternal Aunt     Social History Social History   Tobacco Use  . Smoking status: Current Every Day Smoker    Packs/day: 0.50    Years: 42.00    Pack years: 21.00    Types: Cigarettes  . Smokeless tobacco: Never Used  Substance Use Topics  . Alcohol use: No  . Drug use: No    Review of Systems Constitutional: No fever/chills Eyes: No visual changes. ENT: No sore throat. Cardiovascular: Denies chest pain. Respiratory: Denies shortness of breath. Gastrointestinal: No abdominal pain.  No nausea, no vomiting.  No diarrhea.  No constipation. Genitourinary: Negative for dysuria. Musculoskeletal: Negative for  back pain. Skin: Negative for rash. Neurological: Negative for headaches, focal weakness or numbness. Psychiatric:Anxiety and depression. Endocrine:Hyperlipidemia and hypertension.  ____________________________________________   PHYSICAL EXAM:  VITAL SIGNS: ED Triage Vitals  Enc Vitals Group     BP 04/05/17 1400 (!) 145/83     Pulse Rate 04/05/17 1400 95     Resp 04/05/17 1400 20     Temp 04/05/17 1400 98.4 F (36.9 C)     Temp Source 04/05/17 1400 Oral     SpO2 04/05/17 1400 98 %     Weight 04/05/17 1401 201 lb (91.2 kg)     Height 04/05/17 1401 5\' 6"  (1.676 m)     Head Circumference --      Peak Flow --      Pain Score 04/05/17 1401 6     Pain Loc --      Pain Edu? --      Excl. in [place]? --    Constitutional: Alert and oriented. Well appearing and in no acute distress. Cardiovascular: Normal rate, regular rhythm. Grossly normal heart sounds.  Good peripheral circulation. Respiratory: Normal respiratory effort.  No retractions. Lungs CTAB. Musculoskeletal: No obvious deformity, edema or erythema to the left lower leg.  Patient has moderate guarding with palpation of the popliteal area and mid posterior thigh. Neurologic:  Normal speech and language. No gross focal neurologic deficits are appreciated. No gait instability. Skin:  Skin is warm, dry and intact. No rash noted. Psychiatric: Mood and affect are normal. Speech and behavior are normal.  ____________________________________________   LABS (all labs ordered are listed, but only abnormal results are displayed)  Labs Reviewed - No data to display ____________________________________________  EKG   ____________________________________________  RADIOLOGY  No acute findings on x-ray of the left knee.  No DVT per ultrasound of the left lower extremity.  Official radiology report(s): US Venous Img Lower Unilateral Left  Result Date: 04/05/2017 CLINICAL DATA:  Left lower extremity pain.  Evaluate for DVT. EXAM:  LEFT LOWER EXTREMITY VENOUS DOPPLER ULTRASOUND TECHNIQUE: Gray-scale sonography with graded compression, as well as color Doppler and duplex ultrasound were performed to evaluate the lower extremity deep venous systems from the level of the common femoral vein and including the common femoral, femoral, profunda femoral, popliteal and calf veins including the posterior tibial, peroneal and gastrocnemius veins when visible. The superficial great saphenous vein was also interrogated. Spectral Doppler was utilized to evaluate flow at rest and with distal augmentation maneuvers in the common femoral, femoral and popliteal veins. COMPARISON:  None. FINDINGS: Contralateral Common Femoral Vein: Respiratory phasicity is normal and symmetric with the symptomatic side. No evidence of thrombus. Normal compressibility. Common Femoral Vein: No evidence of thrombus. Normal compressibility, respiratory phasicity and response to augmentation. Saphenofemoral  Junction: No evidence of thrombus. Normal compressibility and flow on color Doppler imaging. Profunda Femoral Vein: No evidence of thrombus. Normal compressibility and flow on color Doppler imaging. Femoral Vein: No evidence of thrombus. Normal compressibility, respiratory phasicity and response to augmentation. Popliteal Vein: No evidence of thrombus. Normal compressibility, respiratory phasicity and response to augmentation. Calf Veins: No evidence of thrombus. Normal compressibility and flow on color Doppler imaging. Superficial Great Saphenous Vein: No evidence of thrombus. Normal compressibility. Venous Reflux:  None. Other Findings:  None. IMPRESSION: No evidence of DVT within the left lower extremity. Electronically Signed   By: Sandi Mariscal M.D.   On: 04/05/2017 15:13   Dg Knee Complete 4 Views Left  Result Date: 04/05/2017 CLINICAL DATA:  Pt reports that her left knee starting giving out randomly last night and into today. Pt says that it gave out today and caused her  to fall and now she is experiencing pain in that knee that she didn't feel before. EXAM: LEFT KNEE - COMPLETE 4+ VIEW COMPARISON:  None. FINDINGS: No evidence of fracture, dislocation, or joint effusion. No evidence of arthropathy or other focal bone abnormality. Soft tissues are unremarkable. IMPRESSION: Negative. Electronically Signed   By: Lajean Manes M.D.   On: 04/05/2017 14:57    ____________________________________________   PROCEDURES  Procedure(s) performed: None  Procedures  Critical Care performed: No  ____________________________________________   INITIAL IMPRESSION / ASSESSMENT AND PLAN / ED COURSE  As part of my medical decision making, I reviewed the following data within the [facility]    Patient presents with right knee pain secondary to a fall today.  Patient also concerned secondary to left posterior thigh pain for 6 days.  Physical exam was grossly unremarkable.  Patient given discharge care instructions and advised to consider elastic knee support.  Patient given a prescription for naproxen advised follow-up PCP for continued care.      ____________________________________________   FINAL CLINICAL IMPRESSION(S) / ED DIAGNOSES  Final diagnoses:  Left leg pain     ED Discharge Orders        Ordered    naproxen (NAPROSYN) 375 MG tablet  2 times daily with meals     04/05/17 1525       Note:  This document was prepared using Dragon voice recognition software and may include unintentional dictation errors.    Sable Feil, PA-C 04/05/17 1531    Darel Hong, MD 04/05/17 1534

## 2017-04-05 NOTE — ED Notes (Signed)
X-ray at bedside

## 2017-06-20 ENCOUNTER — Encounter: Payer: Self-pay | Admitting: Emergency Medicine

## 2017-06-20 ENCOUNTER — Emergency Department
Admission: EM | Admit: 2017-06-20 | Discharge: 2017-06-24 | Disposition: A | Discharge status: Home / Self Care | Payer: Medicare HMO | Attending: Emergency Medicine | Admitting: Emergency Medicine

## 2017-06-20 ENCOUNTER — Emergency Department: Payer: Medicare HMO

## 2017-06-20 ENCOUNTER — Other Ambulatory Visit: Payer: Self-pay

## 2017-06-20 DIAGNOSIS — R05 Cough: Secondary | ICD-10-CM | POA: Diagnosis not present

## 2017-06-20 DIAGNOSIS — Z5321 Procedure and treatment not carried out due to patient leaving prior to being seen by health care provider: Secondary | ICD-10-CM | POA: Diagnosis not present

## 2017-06-20 NOTE — ED Triage Notes (Signed)
Pt reports that she has had a cough for the last 2 months, now when she coughs her back is sore. States that her kids told her the cough is from her smoking. Reports that the cough happens when she goes out in the heat. Cough is non-productive. Pt is able to speak in complete sentences without difficulty.

## 2017-06-20 NOTE — ED Notes (Signed)
Says cough for about 2.5 months.  No fever.  Sometimes is productive.  Has been using otc meds.

## 2018-04-09 ENCOUNTER — Telehealth: Payer: Self-pay

## 2018-04-09 NOTE — Telephone Encounter (Signed)
Called patient from recall list.  No answer. No voicemail.  Patient is past due for a 12 month follow up.  Patient will need an evisit

## 2018-04-15 ENCOUNTER — Telehealth: Payer: Self-pay

## 2018-04-15 NOTE — Telephone Encounter (Signed)
Virtual Visit Pre-Appointment Phone Call  Steps For Call:  1. Confirm consent - "In the setting of the current Covid19 crisis, you are scheduled for a TELEPHONE visit with your provider on 04/15/2018 at 11:00am.  Just as we do with many in-office visits, in order for you to participate in this visit, we must obtain consent.  If you'd like, I can send this to your mychart (if signed up) or email for you to review.  Otherwise, I can obtain your verbal consent now.  All virtual visits are billed to your insurance company just like a normal visit would be.  By agreeing to a virtual visit, we'd like you to understand that the technology does not allow for your provider to perform an examination, and thus may limit your provider's ability to fully assess your condition.  Finally, though the technology is pretty good, we cannot assure that it will always work on either your or our end, and in the setting of a video visit, we may have to convert it to a phone-only visit.  In either situation, we cannot ensure that we have a secure connection.  Are you willing to proceed?" STAFF: Did the patient verbally acknowledge consent to telehealth visit? YES  2. Confirm the BEST phone number to call the day of the visit: YES   3. Give patient instructions for WebEx/MyChart download to smartphone as below or Doximity/Doxy.me if video visit (depending on what platform provider is using)  4. Advise patient to be prepared with any vital sign or heart rhythm information, their current medicines, and a piece of paper and pen handy for any instructions they may receive the day of their visit  5. Inform patient they will receive a phone call 15 minutes prior to their appointment time (may be from unknown caller ID) so they should be prepared to answer  6. Confirm that appointment type is correct in Epic appointment notes (video vs telephone)     TELEPHONE CALL NOTE  Tami Sanders has been deemed a candidate for a  follow-up tele-health visit to limit community exposure during the Covid-19 pandemic. I spoke with the patient via phone to ensure availability of phone/video source, confirm preferred email & phone number, and discuss instructions and expectations.  I reminded Tami Sanders to be prepared with any vital sign and/or heart rhythm information that could potentially be obtained via home monitoring, at the time of her visit. I reminded Tami Sanders to expect a phone call at the time of her visit if her visit.  Tami Sanders, [place] 04/15/2018 2:26 PM   DOWNLOADING THE WEBEX APP TO SMARTPHONE  - If Apple, ask patient to go to [employer] and type in WebEx in the search bar. [employer], the blue/green circle. If Android, go to [employer] and type in [employer] in the search bar. The app is free but as with any other app downloads, their phone may require them to verify saved payment information or Apple/Android password.  - The patient does NOT have to create an account. - On the day of the visit, the assist will walk the patient through joining the meeting with the meeting number/password.  DOWNLOADING THE MYCHART APP TO SMARTPHONE  - If Apple, go to [employer] and type in MyChart in the search bar and download the app. If Android, ask patient to go to [employer] and type in [place] in the search bar and download the  app. The app is free but as with any other app downloads, their phone may require them to verify saved payment information or Apple/Android password.  - The patient will need to then log into the app with their MyChart username and password, and select  as their healthcare provider to link the account. When it is time for your visit, go to the MyChart app, find appointments, and click Begin Video Visit. Be sure to Select Allow for your device to access the Microphone and Camera for your visit. You will then be connected, and your provider will be with  you shortly.  **If they have any issues connecting, or need assistance please contact [place] [phone])**  **If using a computer, in order to ensure the best quality for your visit they will need to use either of the following Internet Browsers: Microsoft Kaser, or Google Chrome**  [place]   I hereby voluntarily request, consent and authorize [facility] and its employed or contracted physicians, physician assistants, nurse practitioners or other licensed health care professionals (the Practitioner), to provide me with telemedicine health care services (the Services") as deemed necessary by the treating Practitioner. I acknowledge and consent to receive the Services by the Practitioner via telemedicine. I understand that the telemedicine visit will involve communicating with the Practitioner through live audiovisual communication technology and the disclosure of certain medical information by electronic transmission. I acknowledge that I have been given the opportunity to request an in-person assessment or other available alternative prior to the telemedicine visit and am voluntarily participating in the telemedicine visit.  I understand that I have the right to withhold or withdraw my consent to the use of telemedicine in the course of my care at any time, without affecting my right to future care or treatment, and that the Practitioner or I may terminate the telemedicine visit at any time. I understand that I have the right to inspect all information obtained and/or recorded in the course of the telemedicine visit and may receive copies of available information for a reasonable fee.  I understand that some of the potential risks of receiving the Services via telemedicine include:   Delay or interruption in medical evaluation due to technological equipment failure or disruption;  Information transmitted may not be sufficient (e.g. poor  resolution of images) to allow for appropriate medical decision making by the Practitioner; and/or   In rare instances, security protocols could fail, causing a breach of personal health information.  Furthermore, I acknowledge that it is my responsibility to provide information about my medical history, conditions and care that is complete and accurate to the best of my ability. I acknowledge that Practitioner's advice, recommendations, and/or decision may be based on factors not within their control, such as incomplete or inaccurate data provided by me or distortions of diagnostic images or specimens that may result from electronic transmissions. I understand that the practice of medicine is not an exact science and that Practitioner makes no warranties or guarantees regarding treatment outcomes. I acknowledge that I will receive a copy of this consent concurrently upon execution via email to the email address I last provided but may also request a printed copy by calling the office of [facility].    I understand that my insurance will be billed for this visit.   I have read or had this consent read to me.  I understand the contents of this consent, which adequately explains the benefits and  risks of the Services being provided via telemedicine.   I have been provided ample opportunity to ask questions regarding this consent and the Services and have had my questions answered to my satisfaction.  I give my informed consent for the services to be provided through the use of telemedicine in my medical care  By participating in this telemedicine visit I agree to the above.

## 2018-04-15 NOTE — Telephone Encounter (Signed)
Spoke with patient.  Reviewed available appointments for Evisits with patient.  Patient stated she did not have a smartphone.  Scheduled her for a TELEPHONE visit.

## 2018-04-18 ENCOUNTER — Telehealth: Payer: Self-pay

## 2018-04-18 ENCOUNTER — Encounter: Payer: Self-pay | Admitting: Cardiovascular Disease

## 2018-04-18 ENCOUNTER — Other Ambulatory Visit: Payer: Self-pay

## 2018-04-18 ENCOUNTER — Telehealth (INDEPENDENT_AMBULATORY_CARE_PROVIDER_SITE_OTHER): Payer: Medicare HMO | Admitting: Cardiovascular Disease

## 2018-04-18 ENCOUNTER — Telehealth: Payer: Self-pay | Admitting: Cardiovascular Disease

## 2018-04-18 VITALS — Ht 66.0 in | Wt 212.0 lb

## 2018-04-18 DIAGNOSIS — F17200 Nicotine dependence, unspecified, uncomplicated: Secondary | ICD-10-CM

## 2018-04-18 DIAGNOSIS — I10 Essential (primary) hypertension: Secondary | ICD-10-CM

## 2018-04-18 DIAGNOSIS — R079 Chest pain, unspecified: Secondary | ICD-10-CM | POA: Diagnosis not present

## 2018-04-18 DIAGNOSIS — J432 Centrilobular emphysema: Secondary | ICD-10-CM | POA: Diagnosis not present

## 2018-04-18 DIAGNOSIS — R002 Palpitations: Secondary | ICD-10-CM | POA: Diagnosis not present

## 2018-04-18 MED ORDER — POTASSIUM CHLORIDE ER 10 MEQ PO TBCR: 10.0000 meq | EXTENDED_RELEASE_TABLET | Freq: Every day | ORAL | 11 refills | Status: DC

## 2018-04-18 NOTE — Progress Notes (Signed)
Virtual Visit via Telephone Note   This visit type was conducted due to national recommendations for restrictions regarding the COVID-19 Pandemic (e.g. social distancing) in an effort to limit this patient's exposure and mitigate transmission in our community.  Due to her co-morbid illnesses, this patient is at least at moderate risk for complications without adequate follow up.  This format is felt to be most appropriate for this patient at this time.  The patient did not have access to video technology/had technical difficulties with video requiring transitioning to audio format only (telephone).  All issues noted in this document were discussed and addressed.  No physical exam could be performed with this format.  Please refer to the patient's chart for her  consent to telehealth for [facility].   I connected with  Tami Sanders on 04/18/18 by a video enabled telemedicine application and verified that I am speaking with the correct person using two identifiers. I discussed the limitations of evaluation and management by telemedicine. The patient expressed understanding and agreed to proceed.   Evaluation Performed:  Follow-up visit  Date:  04/18/2018   ID:  Tami Sanders, DOB [birth year], MRN [id number]  Patient Location:  [facility] [address] [place] [address]   Provider location:   [facility], [place] office  PCP:  Tami Merles, MD  Cardiologist:  Arvid Right [facility]   Chief Complaint:  Left pain, knee pain, cramping    History of Present Illness:    Tami Sanders is a 68 y.o. female who presents via audio/video conferencing for a telehealth visit today.   The patient does not symptoms concerning for COVID-19 infection (fever, chills, cough, or new SHORTNESS OF BREATH).   Patient has a past medical history of With history of smoking HTN Obesity Prior episodes of chest pain mild central chest discomfort,  mild shortness of breath.  Low  potassium from HCTZ cardiac catheterization 2013, no significant coronary disease  She presents for follow-up of chest pain, shortness of breath, palpitations  "its been good"  In the ER with  leg pain, 04/2017 Hospital records reviewed with the patient in detail Fall onto right knee, left leg pain, thigh area  Low potassium : chronically low She is having cramps, left leg  Still smoking, 1/2 pack per day Try to quit, "nerves get bad"  follows with Dr. Lennox Grumbles at [facility].  Sedentary, legs weak No recent falls  Vitals: BP : 110/67, Pulse: 56 Resp: 16    Prior CV studies:   The following studies were reviewed today:  chest wall mass right lateral chest wall, scheduled to have lipoma resected  Previously seen in the emergency room for chest pain, Atypical in nature, workup negative  Stress test 2013 [facility]  Cardiac cath 2013: "normal"  cardiac catheterization from [facility] 2013. She reports at that time nose no significant coronary disease   Past Medical History:  Diagnosis Date  . Anxiety   . Depression   . GERD (gastroesophageal reflux disease)   . Hyperlipidemia   . Hypertension    Past Surgical History:  Procedure Laterality Date  . CARDIAC CATHETERIZATION    . COLONOSCOPY    . MASS EXCISION Right 10/09/2016   Procedure: EXCISION MASS RIGHT LATERAL CHEST WALL;  Surgeon: Christene Lye, MD;  Location: [facility] ORS;  Service: General;  Laterality: Right;     Current Meds  Medication Sig  . amLODipine (NORVASC) 10 MG tablet  Take 10 mg by mouth every morning.   . hydrALAZINE (APRESOLINE) 10 MG tablet Take 10 mg by mouth every morning.   . hydrochlorothiazide (HYDRODIURIL) 25 MG tablet Take 25 mg by mouth daily.  Marland Kitchen ibuprofen (ADVIL,MOTRIN) 200 MG tablet Take 200 mg by mouth every 8 (eight) hours as needed for headache or mild pain.  Marland Kitchen losartan (COZAAR) 100 MG tablet Take 100 mg by mouth daily.  . naproxen (NAPROSYN) 375 MG tablet Take 1 tablet (375 mg  total) by mouth 2 (two) times daily with a meal.  . nitroGLYCERIN (NITROSTAT) 0.3 MG SL tablet Place 0.3 mg under the tongue every 5 (five) minutes as needed for chest pain.  Marland Kitchen omeprazole (PRILOSEC) 10 MG capsule Take 10 mg by mouth every morning.   . traZODone (DESYREL) 100 MG tablet Take 50 mg by mouth at bedtime as needed for sleep.      Allergies:   Patient has no known allergies.   Social History   Tobacco Use  . Smoking status: Current Every Day Smoker    Packs/day: 0.50    Years: 42.00    Pack years: 21.00    Types: Cigarettes  . Smokeless tobacco: Never Used  Substance Use Topics  . Alcohol use: No  . Drug use: No     Current Outpatient Medications on File Prior to Visit  Medication Sig Dispense Refill  . amLODipine (NORVASC) 10 MG tablet Take 10 mg by mouth every morning.     . hydrALAZINE (APRESOLINE) 10 MG tablet Take 10 mg by mouth every morning.     . hydrochlorothiazide (HYDRODIURIL) 25 MG tablet Take 25 mg by mouth daily.    Marland Kitchen ibuprofen (ADVIL,MOTRIN) 200 MG tablet Take 200 mg by mouth every 8 (eight) hours as needed for headache or mild pain.    Marland Kitchen losartan (COZAAR) 100 MG tablet Take 100 mg by mouth daily.    . naproxen (NAPROSYN) 375 MG tablet Take 1 tablet (375 mg total) by mouth 2 (two) times daily with a meal. 20 tablet 0  . nitroGLYCERIN (NITROSTAT) 0.3 MG SL tablet Place 0.3 mg under the tongue every 5 (five) minutes as needed for chest pain.    Marland Kitchen omeprazole (PRILOSEC) 10 MG capsule Take 10 mg by mouth every morning.     . traZODone (DESYREL) 100 MG tablet Take 50 mg by mouth at bedtime as needed for sleep.      No current facility-administered medications on file prior to visit.      Family Hx: The patient's family history includes Breast cancer in her mother and paternal aunt; Cancer in her father, mother, and paternal aunt.  ROS:   Please see the history of present illness.    Review of Systems  Constitutional: Negative.   Respiratory: Negative.    Cardiovascular: Negative.   Gastrointestinal: Negative.   Musculoskeletal: Positive for joint pain and myalgias.  Neurological: Negative.   Psychiatric/Behavioral: Negative.   All other systems reviewed and are negative.     Labs/Other Tests and Data Reviewed:    Recent Labs: No results found for requested labs within last 8760 hours.   Recent Lipid Panel No results found for: CHOL, TRIG, HDL, CHOLHDL, LDLCALC, LDLDIRECT  Wt Readings from Last 3 Encounters:  04/18/18 212 lb (96.2 kg)  06/20/17 200 lb (90.7 kg)  04/05/17 201 lb (91.2 kg)     Exam:    Vital Signs: Vital signs may also be detailed in the HPI Ht 5\' 6"  (1.676 m)  Wt 212 lb (96.2 kg)   BMI 34.22 kg/m   Wt Readings from Last 3 Encounters:  04/18/18 212 lb (96.2 kg)  06/20/17 200 lb (90.7 kg)  04/05/17 201 lb (91.2 kg)   Temp Readings from Last 3 Encounters:  06/20/17 98.9 F (37.2 C) (Oral)  04/05/17 98.4 F (36.9 C) (Oral)  10/09/16 98 F (36.7 C) (Oral)   BP Readings from Last 3 Encounters:  06/20/17 (!) 143/74  04/05/17 (!) 145/83  10/16/16 140/70   Pulse Readings from Last 3 Encounters:  06/20/17 92  04/05/17 95  10/16/16 72    BP : 110/67, Pulse: 56 Resp: 30  Well nourished, well developed female in no acute distress. Constitutional:  oriented to person, place, and time. No distress.     ASSESSMENT & PLAN:    Centrilobular emphysema ([place]) We have encouraged her to continue to work on weaning her cigarettes and smoking cessation. She will continue to work on this and does not want any assistance with chantix.   Essential hypertension Blood pressure stable,  Potassium chronically running low dating back several years secondary to HCTZ We will start potassium 10 mEq daily Requested lab work from primary care Suspect she will need 20 mEq daily  Smoker Long discussion with her concerning various strategies, she is trying Does not want Chantix at this time  Palpitations Denies  having significant palpitations No further work-up ordered  Chest pain, unspecified type Denies any chest pain concerning for angina She is sedentary at baseline   COVID-19 Education: The signs and symptoms of COVID-19 were discussed with the patient and how to seek care for testing (follow up with PCP or arrange E-visit).  The importance of social distancing was discussed today.  Patient Risk:   After full review of this patients clinical status, I feel that they are at least moderate risk at this time.  Time:   Today, I have spent 25 minutes with the patient with telehealth technology discussing the cardiac and medical problems/diagnoses detailed above   10 min spent reviewing the chart prior to patient visit today   Medication Adjustments/Labs and Tests Ordered: Current medicines are reviewed at length with the patient today.  Concerns regarding medicines are outlined above.   Tests Ordered: No tests ordered   Medication Changes: No changes made   Disposition: Follow-up in 6 months   Signed, Ida Rogue, MD  04/18/2018 11:09 AM    [place] Office [address] [place] [address], [place], [place] [address]

## 2018-04-18 NOTE — Telephone Encounter (Signed)
Spoke with patient.  Obtained vitals from patient.  Reviewed medications and allergies with patient.

## 2018-04-18 NOTE — Telephone Encounter (Signed)
I spoke with staff at [facility]- Dr. Delight Stare. Requested BMP results be faxed per Dr. Donivan Scull request.  Per staff, last BMP is from January 2020 and they will fax this over.

## 2018-04-18 NOTE — Patient Instructions (Addendum)
Medication Instructions:  - Your physician has recommended you make the following change in your medication:   1) Start potassium 10 meq- take 1 tablet by mouth once daily   If you need a refill on your cardiac medications before your next appointment, please call your pharmacy.   Lab work: No new labs needed   If you have labs (blood work) drawn today and your tests are completely normal, you will receive your results only by: Marland Kitchen MyChart Message (if you have MyChart) OR . A paper copy in the mail If you have any lab test that is abnormal or we need to change your treatment, we will call you to review the results.   Testing/Procedures: No new testing needed   Follow-Up: At [facility], you and your health needs are our priority.  As part of our continuing mission to provide you with exceptional heart care, we have created designated Provider Care Teams.  These Care Teams include your primary Cardiologist (physician) and Advanced Practice Providers (APPs -  Physician Assistants and Nurse Practitioners) who all work together to provide you with the care you need, when you need it.  . You will need a follow up appointment in 12 months .   Please call our office 2 months in advance to schedule this appointment.    . Providers on your designated Care Team:   . Murray Hodgkins, NP . Christell Faith, PA-C . Marrianne Mood, PA-C  Any Other Special Instructions Will Be Listed Below (If Applicable).  For educational health videos Log in to : [email or web address] Or : [email or web address], password : [email or web address]

## 2018-04-18 NOTE — Telephone Encounter (Signed)
BMP received from the patient's PCP. Scanned and forwarded to Dr. Rockey Situ to review.

## 2018-04-18 NOTE — Telephone Encounter (Signed)
Checked fax machine and nurse bin, no labs at this time.

## 2018-04-23 NOTE — Telephone Encounter (Signed)
Please review labs you requested under labs tab.

## 2018-04-24 NOTE — Telephone Encounter (Signed)
Continue potassium 10 daily with her HCTZ Potassium in 01/2018 was low end of normal  Also needs crestor for very high cholesterol, As smoker is high risk of blockages Would start crestor 10 mg daily Recheck lipids/LFTs in 3 months

## 2018-04-25 MED ORDER — ROSUVASTATIN CALCIUM 10 MG PO TABS: 10.0000 mg | ORAL_TABLET | Freq: Every day | ORAL | 3 refills | Status: DC

## 2018-04-25 NOTE — Addendum Note (Signed)
Addended by: Valora Corporal on: 04/25/2018 05:17 PM   Modules accepted: Orders

## 2018-04-25 NOTE — Telephone Encounter (Signed)
Spoke with patients daughter per release form and reviewed providers recommendations based on labs we received from [facility]. She was agreeable with these changes and will let her mother know. Also reviewed recommendations for labs to be done in 3 months and will send message over to her PCP about this with our fax number to send them to [place]. She was appreciative for the call with no further questions at this time.

## 2018-10-15 IMAGING — CR DG CHEST 2V
1 series · 2 of 2 positions shown · non-contrast
Comparison: 02/17/2012

CLINICAL DATA: 65-year-old female with a history of substernal
chest pain

EXAM:
CHEST  2 VIEW

[Series 1: w chest pa · 0.14mm/px · 2 of 2 slices shown]
[im 1/2]
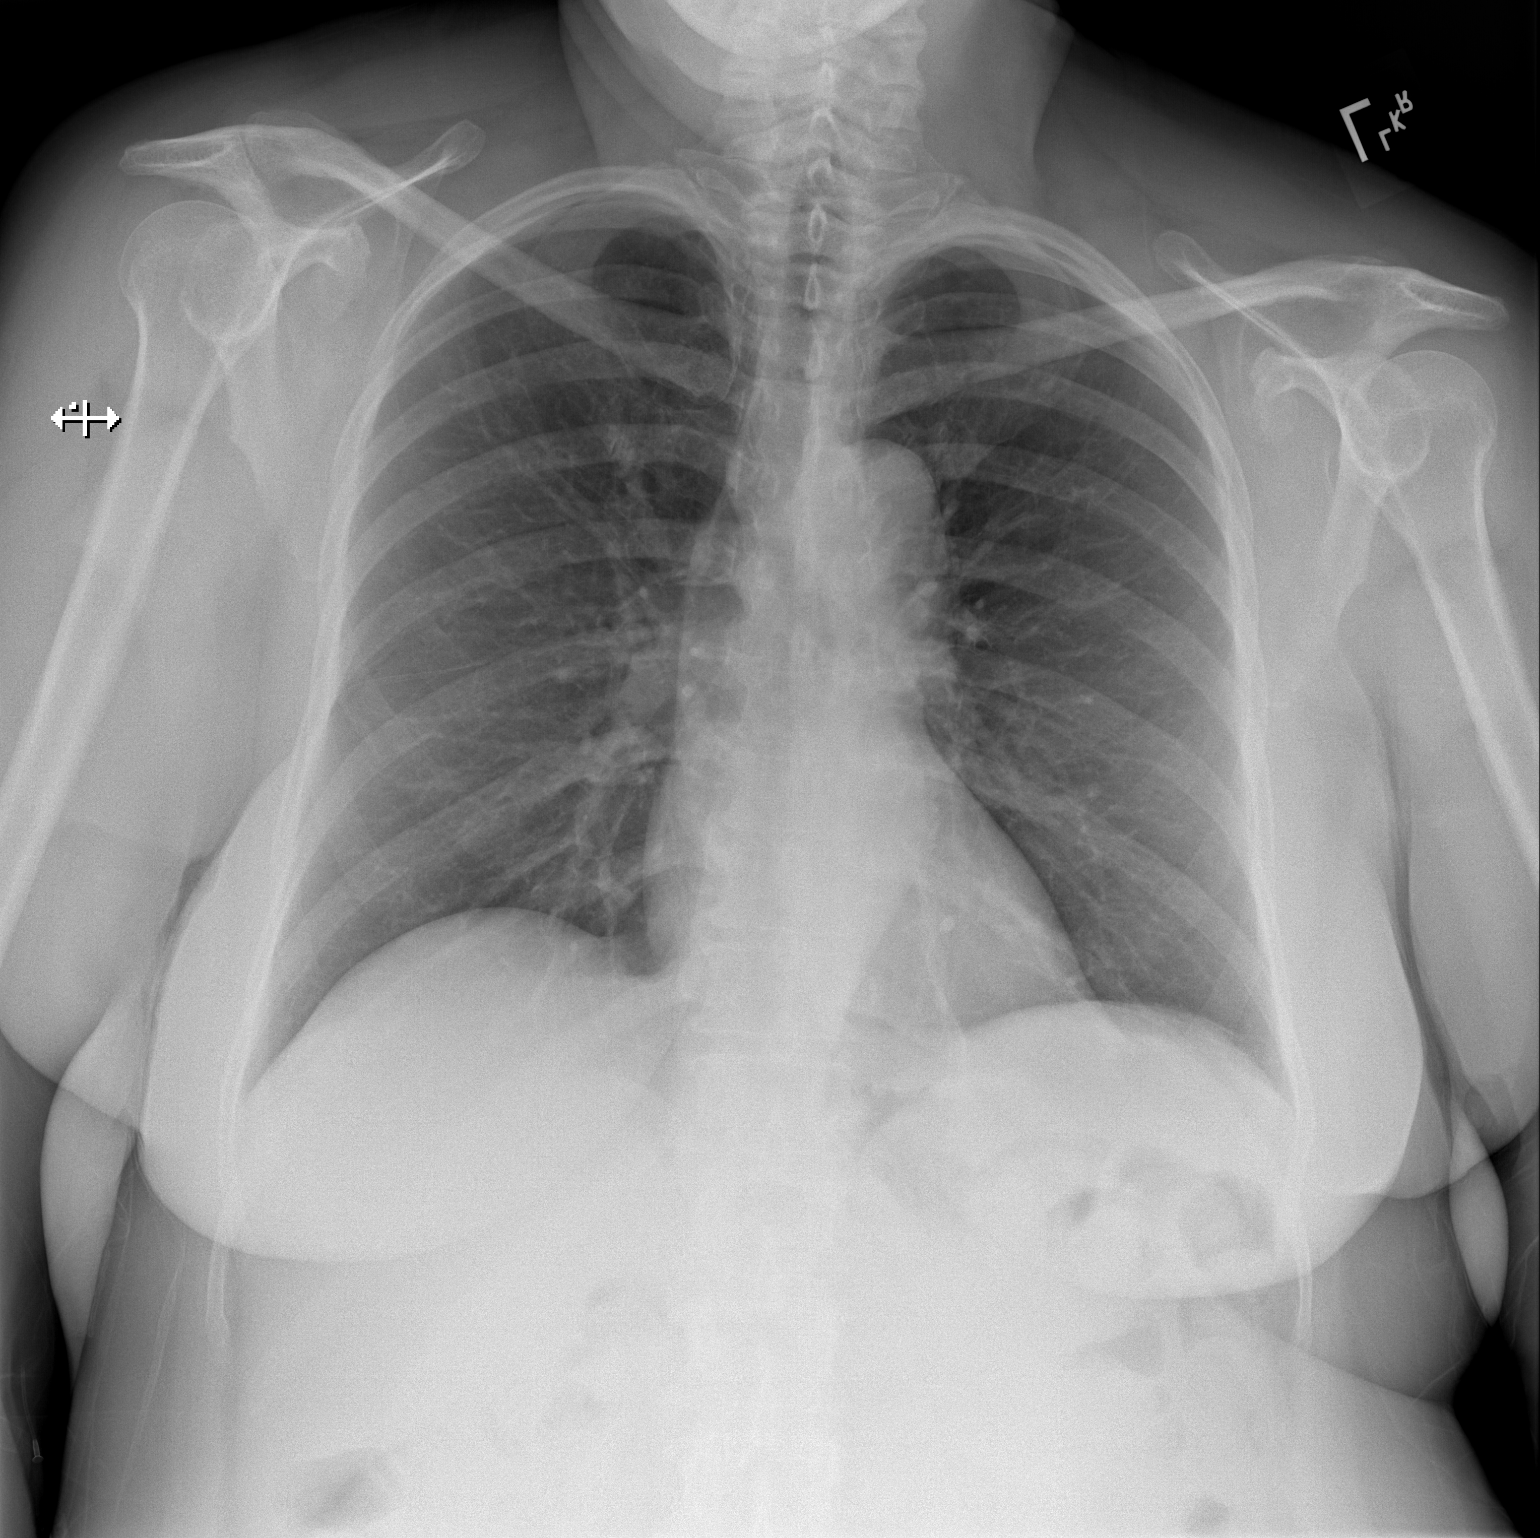
[im 2/2]
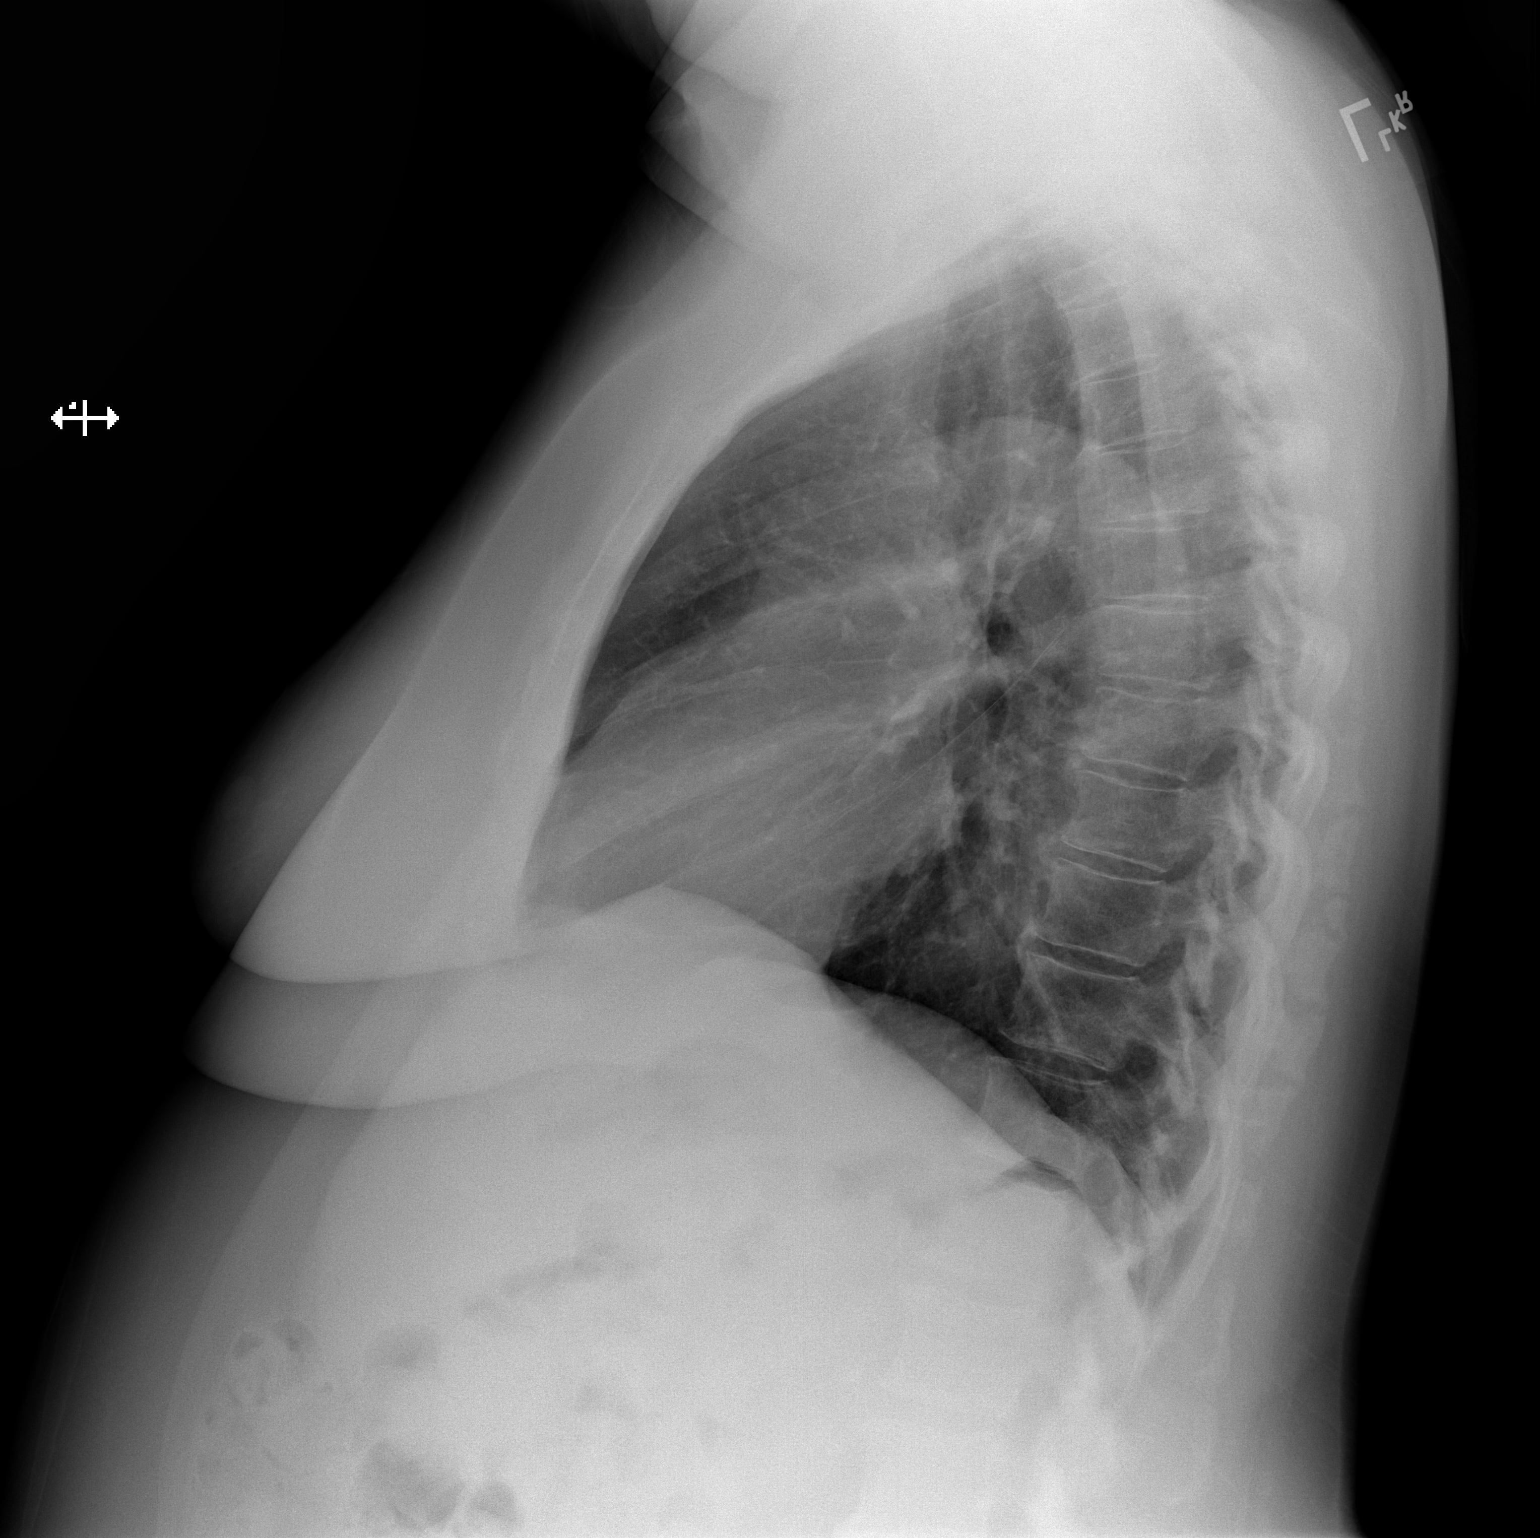

[2 of 2 positions shown; findings below may reference images not displayed]

FINDINGS: Cardiomediastinal silhouette unchanged in size and contour. No
central vascular congestion. No evidence of interlobular septal
thickening. No pneumothorax or pleural effusion.

No confluent airspace disease.

No displaced fracture.
IMPRESSION: No radiographic evidence of acute cardiopulmonary disease

## 2019-04-30 NOTE — Progress Notes (Signed)
Office Visit    Patient Name: Tami Sanders Date of Encounter: 05/04/2019  Primary Care Provider:  Marguerita Merles, [facility] Primary Cardiologist:  Ida Rogue, MD  Chief Complaint    Chief Complaint  Patient presents with  . office visit    Pt has a fluttry feeling in chest/ dizziness for 3 days. Meds verbally reviewed w/ pt.     69 year old female with history of cardiac catheterization 2013 without significant CAD, HLD, hypertension, DM2, GERD,  smoking, obesity, and who presents for 12 month folllow-up of CP, SOB, and palpitations.  Past Medical History    Past Medical History:  Diagnosis Date  . Anxiety   . Depression   . GERD (gastroesophageal reflux disease)   . Hyperlipidemia   . Hypertension    Past Surgical History:  Procedure Laterality Date  . CARDIAC CATHETERIZATION    . COLONOSCOPY    . MASS EXCISION Right 10/09/2016   Procedure: EXCISION MASS RIGHT LATERAL CHEST WALL;  Surgeon: Christene Lye, MD;  Location: [facility] ORS;  Service: General;  Laterality: Right;    Allergies  No Known Allergies  History of Present Illness    Tami Sanders is a 69 y.o. female with PMH as above. She has a history of chronically low K, including with taking HCTZ. She also has a history of leg cramps, notably in the left leg. She was last seen in the office 04/18/2018, at which time she reported doing fairly well from a cardiac standpoint. She was still smoking 1/2 pack per day but attempting to quit. She had not had any recent falls.   Today, she reports that she has been significantly dizzy x3 days. She notes that, due to her symptoms, she was unable to drive to her cardiology visit today and instead was driven by her daughter. She reports this dizziness occurs inconsistently but most often with sudden head positions, such as turning her head quickly.  She notes amaurosis fugax and blurry vision but uncertain if the vision changes occur at the same time as the  dizziness or elevated BP. She reports "hearing the ocean in her ear" at times. She is checking her BP at home and unable to clarify if any of her detailed sx (as outlined in her ROS and HPI) occur with high or low pressures. She denies ever having a carotid study performed in the past. She has not recently seen the eye doctor. She denies any dizziness with position changes and reports adequate hydration. Orthostatics today borderline and copied below.  She took a Benadryl last night, which she feels helped her dizziness. She wonders if her dizziness could thus be 2/2 inner ear problems from allergies; however, she denies any current head congestion. She notes ongoing fatigue with unclear chronicity. Her family has recently informed her that she snores loudly at night with her son recording her snoring for her, given she did not at first believe she snores. She often wakes choking at night and with a dry mouth, which she suspects is because she sleeps with her mouth open. She reports chronic fatigue that is worse in the AM and early afternoon. She also reports mild early morning HA. She has never had a sleep study performed.  She denies abdominal distention, LEE, or early satiety.  She notes using 6 pillows at night, which is not new for her, and reportedly only to help her snoring rather than 2/2 SOB with laying flat. Dizziness occurs both with  activity and at rest; however, no reported CP or SOB/DOE with rest or exertion.  No racing HR.  She reports ongoing/unchanged palpitations.  The palpitations do not occur at the same time as her recent dizziness.   She is drinking 1 cup of coffee per day and 1 soda per week and denies alcohol use. No presyncope, syncope, LOC, or falls. No signs or symptoms consistent with bleeding.  She is smoking 1 pack per week and indicates a desire to quit smoking. She reports medication compliance.  Recent [facility] records brought today with many differences noted in medications and  doses of medications and reviewed in detail, confirmed / clarified with patient, and with updates provided to staff to update our current records / clinic medication list.  Unfortunately, she is uncertain if sx occurred with any medication changes, as well as if any medication changes were made due to reported sx. most recent [place] clinic vitals and labs are transcribed below.    Home Medications    Prior to Admission medications   Medication Sig Start Date End Date Taking? Authorizing Provider  amLODipine (NORVASC) 10 MG tablet Take 10 mg by mouth every morning.     [provider]  hydrALAZINE (APRESOLINE) 10 MG tablet Take 10 mg by mouth every morning.     [provider]  hydrochlorothiazide (HYDRODIURIL) 25 MG tablet Take 25 mg by mouth daily.    [provider]  ibuprofen (ADVIL,MOTRIN) 200 MG tablet Take 200 mg by mouth every 8 (eight) hours as needed for headache or mild pain.    [provider]  losartan (COZAAR) 100 MG tablet Take 100 mg by mouth daily. 03/12/18   [provider]  naproxen (NAPROSYN) 375 MG tablet Take 1 tablet (375 mg total) by mouth 2 (two) times daily with a meal. 04/05/17   Sable Feil, PA-C  nitroGLYCERIN (NITROSTAT) 0.3 MG SL tablet Place 0.3 mg under the tongue every 5 (five) minutes as needed for chest pain.    [provider]  omeprazole (PRILOSEC) 10 MG capsule Take 10 mg by mouth every morning.     [provider]  potassium chloride (K-DUR) 10 MEQ tablet Take 1 tablet (10 mEq total) by mouth daily. 04/18/18 07/17/18  Minna Merritts, MD  rosuvastatin (CRESTOR) 10 MG tablet Take 1 tablet (10 mg total) by mouth daily. 04/25/18 07/24/18  Minna Merritts, MD  traZODone (DESYREL) 100 MG tablet Take 50 mg by mouth at bedtime as needed for sleep.     [provider]    Review of Systems    She denies chest pain, dyspnea, orthopnea, n, v, syncope, edema, weight gain, or early satiety. She  reports dizziness x3 days and with head changes. She reports blurry vision and amaurosis fugax. She reports hearing the ocean in her ear at times. She also reports fatigue, worse in the AM and early afternoon. She reports mild AM HA. She recently discovered she is snoring with dry mouth in the AM and suspected episodes apnea 2/2 waking up with coughing / choking. She reports using 6 pillows to decrease her snoring and not 2/2 orthopnea. She reports ongoing/unchanged palpitations.  The palpitations do not occur at the same time as her recent dizziness.   All other systems reviewed and are otherwise negative except as noted above.  Physical Exam    VS:  BP (!) 144/68 (BP Location: Left Arm, Patient Position: Sitting, Cuff Size: Normal)   Pulse 94  Ht 5\' 6"  (1.676 m)   Wt 200 lb 6 oz (90.9 kg)   SpO2 98%   BMI 32.34 kg/m  , BMI Body mass index is 32.34 kg/m. GEN: Well nourished, well developed, in no acute distress. Mask in place. HEENT: normal. Neck: Supple, no JVD, R sided carotid bruit, no L bruit. No masses.  Cardiac: RRR, 1/6 systolic murmur heard on exam. No rubs or gallops. No clubbing, cyanosis, or significant / pitting edema.  Radials/DP/PT 2+ and equal bilaterally.  Respiratory:  Respirations regular and unlabored without crackles but with bibasilar expiratory wheeze. GI: Soft, nontender, nondistended, BS + x 4. MS: no deformity or atrophy. Skin: warm and dry, no rash. Neuro:  Strength and sensation are intact. Psych: Normal affect. Appears fatigued.   Accessory Clinical Findings    ECG personally reviewed by me today -normal sinus rhythm, 94 bpm, PR interval 162 ms, QRS 76 ms, poor conduction through inferior leads III and aVF, poor R in V1 and V2 and lateral flat T wave as seen in previous EKG 09/20/2016- no acute changes.  VITALS Reviewed today    05/04/19 Orthostatic Vital Signs:   HR BP  Sx    Lying:  94 140/88  "Feels like head going downhill" Sitting:   95 143/83  dizziness   Standing:  102 126/78  dizziness 2 minutes: 104 134/86  mild dizziness -----------------------------------------  Most recent set clinic BP reported 02/23/2019 151/80 03/26/2019: 142/83, left arm sitting  Most recent HR per [place] [facility]:   03/26/19: 83 bpm  Most recent weights per [place] [facility] 03/26/2019: 203 pounds (92.27 kg) 02/23/2019: 209 pounds (95.00 kg) Calculated weight change: -6 pounds (-2.73 kg) Calculated BMI 3/25: 33.39   Temp Readings from Last 3 Encounters:  06/20/17 98.9 F (37.2 C) (Oral)  04/05/17 98.4 F (36.9 C) (Oral)  10/09/16 98 F (36.7 C) (Oral)   BP Readings from Last 3 Encounters:  05/04/19 (!) 144/68  06/20/17 (!) 143/74  04/05/17 (!) 145/83   Pulse Readings from Last 3 Encounters:  05/04/19 94  06/20/17 92  04/05/17 95    Wt Readings from Last 3 Encounters:  05/04/19 200 lb 6 oz (90.9 kg)  04/18/18 212 lb (96.2 kg)  06/20/17 200 lb (90.7 kg)     LABS  reviewed today   MOST RECENT LABS:  02/23/2019 [facility] labs WBC 10.6, hemoglobin 12.6, hematocrit 37.1, MCV 93, platelets 261, neutrophils 7.3 Glucose 362 mg/DL Creatinine 1.18, BUN 11 (increase from previous with hydration encouraged per [facility] note) Sodium 139, potassium 3.5, chloride 96 Calcium 9.1 Albumin 4.3 AST 38, ALT 32 Hemoglobin A1c 10.1 TSH 1.090 Lipase 98 Total cholesterol 217, HDL 46, LDL 141, TG 151 -------------------  PAST labs [facility] labs 05/04/2018 Hgb A1C 10.0 CBC: RBC 3.91, 12.5, HCT 37.7, WBC 10.5, plts 240 BMET: Na 139, K 3.7, BUN 20, Cr 0.98, BUN 20, Ca 9.2, AST 29, ALT 26  07/2011 Total cholesterol 169, HDL 45, LDL 104  Lab Results  Component Value Date   WBC 8.4 07/30/2016   HGB 13.6 10/09/2016   HCT 40.0 10/09/2016   MCV 93.4 07/30/2016   PLT 229 07/30/2016   Lab Results  Component Value Date   CREATININE 0.79 10/04/2016   BUN 18 10/04/2016   NA 141 10/09/2016   K 3.3 (L) 10/09/2016   CL 100 (L)  10/04/2016   CO2 26 10/04/2016   Lab Results  Component Value Date   ALT 24 02/17/2012   AST 30  02/17/2012   ALKPHOS 122 02/17/2012   BILITOT 0.3 02/17/2012   No results found for: CHOL, HDL, LDLCALC, LDLDIRECT, TRIG, CHOLHDL  No results found for: HGBA1C No results found for: TSH   STUDIES/PROCEDURES reviewed today   2013 Catheterization Detail not in EMR on review  Assessment & Plan    Dizziness, new --Reports new dizziness x3 days. Etiology unknown and could include carotid artery stenosis with R sided bruit on exam, new /worsening valvular dz with murmur heard on exam and new when compared with previous notes, uncontrolled HTN, untreated sleep apnea with snoring/fatigue, poorly controlled DM2/hyperglycemia, ongoing or worsening electrolyte abnormalities / hypokalemia, and current tobacco use as outlined below. Borderline orthostatics today as recorded above. Reports adequate hydration with most recent [facility] note reporting recommendation for increased hydration due to Cr bump as indicated in labs. Euvolemic on exam. Update echo and carotids, as well as BMET. Most recent [facility] vitals and labs as above.  Essential HTN, poorly controlled [facility] h/o HTN / CVA --BP remains poorly controlled at 144/68. Consider ongoing elevated BP as contributing to her dizziness and fatigue, as well as visual sx.   --Medication list updates (when compared with [facility] notes) as below / bulleted under medication management below. Of note, she is uncertain if changes to her BP medications by [facility] occurred for treatment vs in response to any adverse effects to previous medications, other than that her Lisinopril was discontinued for cough. --Significant family history of elevated BP and stroke. Brother recently died of stroke in the last year. Per review of [place] clinic notes, sisters with CVA x2 as well as her mother.  --She has been taking hydralazine 25mg  qd as compared  with the BID frequency as indicated in [facility] note. Instructed her to increase to BID dosing for additional BP support. She will check her BP at home to monitor her response to this change. Fluid and low Na diet reviewed. Smoking cessation encouraged and sleep study / pulmonology referral provided. Diet and exercise changes / recommendations reviewed. Could consider RAS scan in the future. Also, consider transition from amlodipine to diltiazem pending EF / updated echo given palpitations, elevated HR at rest, and ongoing elevated BP.  R sided carotid bruit, ? new --New bruit heard on exam as compared with previous documented clinic exams. Given dizziness most consistently occurs with sudden head movements from side to side and R bruit heard on exam, as well as amaurosis fugax and "hearing the ocean in her ears," will obtain a carotid study to r/o significant or obstructive carotid dz.  Risk factor modification with cholesterol and glycemic control recommended. Statin recently changed by [facility] from Crestor to atorvastatin as below. Recommend updated lipid panel in  6-8 weeks from change to reassess lipid response with further titrations +/- lipid clinic referral +/- [id number]  if needed. BP control recommended. She will start checking BP at home and correspond readings with sx.  HLD --Most recent [facility] labs with total cholesterol 217 and LDL 141. LDL not at goal. Crestor discontinued / recently changed by [facility] clinic changed to atorvastatin 20mg  daily. Recommend reassess lipids/LFTs in 6-8 weeks from change per PCP and increase to high intensity atorvastatin 80mg  daily for LDL goal <70 if needed given comorbid conditions. Will reassess at RTC and perform labs if needed at that time.   1/6 systolic murmur, ? new --New systolic murmur as compared with previous cardiac exams. Patient reports that she has been  told by other healthcare providers (in the past) that she has a murmur. Update echo  to rule out new or worsening valvular dz as contributing to her sx of dizziness and fatigue as above in HPI.  Hypokalemia, chronic  --Remains on KCl supplementation 10mg  daily. Most recent [facility] with K 3.5. Repeat BMET to recheck. Consider that K may be lower than labs indicate 2/2 hyperglycemia as well given most recent glucose in the 300s. Further recommendations pending BMET with goal K 4.0.  Palpitations, chronic --Unchanged from previous visits. Palpitations do not occur at the same time as dizziness. Future considerations could include Zio to rule out asx arrhythmia. Will defer for now given stable sx. Recheck electrolytes with BMET today. Update echo as above. Consider transition from amlodipine to diltiazem pending EF / updated echo given palpitations and elevated HR /BP.  Disordered sleep breathing, snoring --As above in HPI. Reports loud snoring, waking up coughing and SOB, and dry mouth when waking. She notes early AM mild HA and that fatigue is worse earlier in the day. Consider untreated sleep apnea as contributing to her sx of fatigue and her elevated pressures. Reviewed untreated sleep apnea and its effects on cardiovascular and overall health. Referral provided to pulmonology for sleep study.   Fatigue --Reports fatigue. Most recent TSH 1.090. Update echo. Obtain sleep study. BP control, smoking cessation, and lifestyle changes recommended, including increasing activity as tolerated and dietary changes. No reported angina or indication for MPI at this time and pending echo update as above. Could consider Zio in the future as above nit deferred given stable palpitations.  Current tobacco use Centrilobular emphysema --Smokes 1 pack per week. Consider as contributing to sx as outlined above. Smoking cessation advised. She will continue to work on cutting back.   DM2, poorly controlled --Consider as contributing to fatigue, dizziness, electrolyte abnormalities, and reported  vision changes. Most recent Hgb A1C 10.1.Started on metformin, Trajenta, and Jardience. Continue to monitor.   Medication management --[facility] medication list updated to reflect changes made by [facility] clinic. Intake med list not similar to that of current as above and updated today. Per review of most recent [place] clinic progress notes, the following changes were made to our EMR and allergies updated if needed:  Discontinued Lisinopril 40 mg daily with reported ACE inhibitor cough by [facility].  EMR allergies updated.  Increased Hydralazine from hydralazine 10 mg daily to hydralazine 25 mg twice daily as increased by [place] clinic.    Discontinued ibuprofen, trazodone, or naproxen.    Increased from Omeprazole 10 mg daily to omeprazole 20 mg daily as increased by [place] clinic.    No change to KCl tab 68mEq daily.   Added metformin 750mg  BID, Trajenta 5mg  daily, and Jardiance 10mg  daily, added by PCP.  Discontinued previous rosuvastatin 10 mg daily   Added atorvastatin 20 mg daily.  (She is uncertain of the reason for change from rosuvastatin to atorvastatin).  No change to Hyzaar 100-25 qd versus our losartan 100 mg daily and HCTZ 25 mg daily. She is uncertain if she is taking two separate medications or 1 single combo medication and notes unclear at this time.  No change to amlodipine 10mg  daily.  Medication changes: Increase hydralazine from 25mg  daily to hydralazine 25mg  BID.Med list updates as above. Consider future diltiazem in lieu of amlodipine and statin / KCl changes if needed as below (discussed with patient today). Labs ordered: BMET  Studies / Imaging ordered: echo, carotids, sleep study  Future considerations: ? repeat lipid and liver (if not done by PCP), ?Increase statin, ? Increase KCl, ? RAS scan. Consider transition from amlodipine to diltiazem pending EF / updated echo given palpitations and elevated HR /BP. Disposition: RTC 1 month   Total time spent with patient  today 55 minutes. This includes reviewing records, evaluating the patient, and coordinating care. Face-to-face time >50%.    Arvil Chaco, PA-C 05/04/2019

## 2019-05-04 ENCOUNTER — Ambulatory Visit (INDEPENDENT_AMBULATORY_CARE_PROVIDER_SITE_OTHER): Payer: Medicare HMO | Admitting: Physician Assistant

## 2019-05-04 ENCOUNTER — Encounter: Payer: Self-pay | Admitting: Physician Assistant

## 2019-05-04 ENCOUNTER — Other Ambulatory Visit: Payer: Self-pay

## 2019-05-04 VITALS — BP 144/68 | HR 94 | Ht 66.0 in | Wt 200.4 lb

## 2019-05-04 DIAGNOSIS — Z79899 Other long term (current) drug therapy: Secondary | ICD-10-CM

## 2019-05-04 DIAGNOSIS — J432 Centrilobular emphysema: Secondary | ICD-10-CM

## 2019-05-04 DIAGNOSIS — R0989 Other specified symptoms and signs involving the circulatory and respiratory systems: Secondary | ICD-10-CM

## 2019-05-04 DIAGNOSIS — R011 Cardiac murmur, unspecified: Secondary | ICD-10-CM

## 2019-05-04 DIAGNOSIS — E785 Hyperlipidemia, unspecified: Secondary | ICD-10-CM

## 2019-05-04 DIAGNOSIS — I10 Essential (primary) hypertension: Secondary | ICD-10-CM

## 2019-05-04 DIAGNOSIS — G4730 Sleep apnea, unspecified: Secondary | ICD-10-CM

## 2019-05-04 DIAGNOSIS — R42 Dizziness and giddiness: Secondary | ICD-10-CM | POA: Diagnosis not present

## 2019-05-04 DIAGNOSIS — F17200 Nicotine dependence, unspecified, uncomplicated: Secondary | ICD-10-CM

## 2019-05-04 DIAGNOSIS — Z8639 Personal history of other endocrine, nutritional and metabolic disease: Secondary | ICD-10-CM

## 2019-05-04 DIAGNOSIS — E119 Type 2 diabetes mellitus without complications: Secondary | ICD-10-CM

## 2019-05-04 DIAGNOSIS — R002 Palpitations: Secondary | ICD-10-CM

## 2019-05-04 DIAGNOSIS — K219 Gastro-esophageal reflux disease without esophagitis: Secondary | ICD-10-CM

## 2019-05-04 MED ORDER — HYDRALAZINE HCL 25 MG PO TABS: ORAL_TABLET | ORAL | 3 refills | Status: DC

## 2019-05-04 NOTE — Patient Instructions (Addendum)
Medication Instructions:  - Your physician has recommended you make the following change in your medication:   1) INCREASE hydralazine 25 mg- take 1 tablet by mouth twice daily   *If you need a refill on your cardiac medications before your next appointment, please call your pharmacy*   Lab Work: - none ordered  If you have labs (blood work) drawn today and your tests are completely normal, you will receive your results only by: Marland Kitchen MyChart Message (if you have MyChart) OR . A paper copy in the mail If you have any lab test that is abnormal or we need to change your treatment, we will call you to review the results.   Testing/Procedures: - Your physician has requested that you have an echocardiogram. Echocardiography is a painless test that uses sound waves to create images of your heart. It provides your doctor with information about the size and shape of your heart and how well your heart's chambers and valves are working. This procedure takes approximately one hour. There are no restrictions for this procedure.  - Your physician has requested that you have a carotid duplex. This test is an ultrasound of the carotid arteries in your neck. It looks at blood flow through these arteries that supply the brain with blood. Allow one hour for this exam. There are no restrictions or special instructions.  - You have been referred to [facility] for evaluation of sleep apnea (they will call you to schedule)   Follow-Up: At [facility], you and your health needs are our priority.  As part of our continuing mission to provide you with exceptional heart care, we have created designated Provider Care Teams.  These Care Teams include your primary Cardiologist (physician) and Advanced Practice Providers (APPs -  Physician Assistants and Nurse Practitioners) who all work together to provide you with the care you need, when you need it.  We recommend signing up for the patient portal called  "MyChart".  Sign up information is provided on this After Visit Summary.  MyChart is used to connect with patients for Virtual Visits (Telemedicine).  Patients are able to view lab/test results, encounter notes, upcoming appointments, etc.  Non-urgent messages can be sent to your provider as well.   To learn more about what you can do with MyChart, go to [email or web address].    Your next appointment:   As soon as cardiac testing is completed   The format for your next appointment:   In Person  Provider:    You may see Ida Rogue, MD or one of the following Advanced Practice Providers on your designated Care Team:    Murray Hodgkins, NP  Christell Faith, PA-C  Marrianne Mood, PA-C    Other Instructions - Please call the office if your dizziness gets worse after increasing your hydralazine dose   Echocardiogram An echocardiogram is a procedure that uses painless sound waves (ultrasound) to produce an image of the heart. Images from an echocardiogram can provide important information about:  Signs of coronary artery disease (CAD).  Aneurysm detection. An aneurysm is a weak or damaged part of an artery wall that bulges out from the normal force of blood pumping through the body.  Heart size and shape. Changes in the size or shape of the heart can be associated with certain conditions, including heart failure, aneurysm, and CAD.  Heart muscle function.  Heart valve function.  Signs of a past heart attack.  Fluid buildup around the heart.  Thickening of the  heart muscle.  A tumor or infectious growth around the heart valves. Tell a health care provider about:  Any allergies you have.  All medicines you are taking, including vitamins, herbs, eye drops, creams, and over-the-counter medicines.  Any blood disorders you have.  Any surgeries you have had.  Any medical conditions you have.  Whether you are pregnant or may be pregnant. What are the risks? Generally,  this is a safe procedure. However, problems may occur, including:  Allergic reaction to dye (contrast) that may be used during the procedure. What happens before the procedure? No specific preparation is needed. You may eat and drink normally. What happens during the procedure?   An IV tube may be inserted into one of your veins.  You may receive contrast through this tube. A contrast is an injection that improves the quality of the pictures from your heart.  A gel will be applied to your chest.  A wand-like tool (transducer) will be moved over your chest. The gel will help to transmit the sound waves from the transducer.  The sound waves will harmlessly bounce off of your heart to allow the heart images to be captured in real-time motion. The images will be recorded on a computer. The procedure may vary among health care providers and hospitals. What happens after the procedure?  You may return to your normal, everyday life, including diet, activities, and medicines, unless your health care provider tells you not to do that. Summary  An echocardiogram is a procedure that uses painless sound waves (ultrasound) to produce an image of the heart.  Images from an echocardiogram can provide important information about the size and shape of your heart, heart muscle function, heart valve function, and fluid buildup around your heart.  You do not need to do anything to prepare before this procedure. You may eat and drink normally.  After the echocardiogram is completed, you may return to your normal, everyday life, unless your health care provider tells you not to do that. This information is not intended to replace advice given to you by your health care provider. Make sure you discuss any questions you have with your health care provider. Document Revised: 04/10/2018 Document Reviewed: 01/21/2016 Elsevier Patient Education  [place].

## 2019-06-10 ENCOUNTER — Other Ambulatory Visit: Payer: Self-pay

## 2019-06-10 ENCOUNTER — Ambulatory Visit (INDEPENDENT_AMBULATORY_CARE_PROVIDER_SITE_OTHER): Payer: Medicare HMO

## 2019-06-10 DIAGNOSIS — R42 Dizziness and giddiness: Secondary | ICD-10-CM | POA: Diagnosis not present

## 2019-06-10 DIAGNOSIS — R011 Cardiac murmur, unspecified: Secondary | ICD-10-CM | POA: Diagnosis not present

## 2019-06-10 MED ORDER — PERFLUTREN LIPID MICROSPHERE: 1.0000 mL | INTRAVENOUS | Status: AC | PRN

## 2019-06-12 ENCOUNTER — Telehealth: Payer: Self-pay | Admitting: Physician Assistant

## 2019-06-12 ENCOUNTER — Telehealth: Payer: Self-pay

## 2019-06-12 NOTE — Telephone Encounter (Signed)
I have spoken with the patient regarding her carotid US results. She is aware that her thyroid does have an abnormal appearance with blood flow being established to that area.  She is aware this will need to be followed by endocrinology as soon as possible and that this could possibly be contributing to some of her symptoms.   The patient does not currently have an endocrinologist.  She is aware I will place a referral for this.  She has follow up on 06/15/19 with Dr. Rockey Situ and is aware to keep this appointment.   As a side note: I spoke with Dr. Rockey Situ this afternoon to see who he would like to refer this patient to in endocrinology. Per Dr. Rockey Situ, the patient should have a designated thyroid US first. He will order this when he sees the patient on Monday and discuss findings further with her.   I will hold off on placing the referral for now.

## 2019-06-12 NOTE — Telephone Encounter (Signed)
-----   Message from Arvil Chaco, PA-C sent at 06/11/2019  2:38 PM EDT ----- Please let Ms. Apodaca know that the pump function of her heart is normal.  The walls of her heart are moving normally. Her heart is stiff, which can happen over time with elevated blood pressure. We will continue to work on controlling her blood pressure (at her visit, we changed her hydralazine 25mg  from once to twice daily dosing for more blood pressure control). 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 This is a common finding, however, and we can  continue to monitor it. Overall, a very reassuring study.

## 2019-06-12 NOTE — Telephone Encounter (Signed)
Arvil Chaco, PA-C  06/11/2019 2:07 PM EDT     Your carotid study was without any evidence of blockage, plaque, or narrowing of both carotids (large arteries on both sides of your neck).   A region of your thyroid appeared abnormal ( 0.6 by 0.6 cm in size) when compared with the rest of your thyroid. There was evidence of blood flow being established to the region as well. This needs further workup by endocrinology as soon as possible.   These thyroid findings could certainly explain her symptoms reported at the time of our visit.   Please refer to endocrinology, even if her symptoms have improved

## 2019-06-12 NOTE — Telephone Encounter (Signed)
Call to patient to review echo results.    Pt verbalized understanding and has no further questions at this time.    Advised pt to call for any further questions or concerns.  No further orders.   

## 2019-06-14 NOTE — Progress Notes (Signed)
Date:  06/15/2019   ID:  Tami Sanders, DOB 12/17/1950, MRN [id number]  Patient Location:  [place] [address]   Provider location:   [facility], [place] office  PCP:  Marguerita Merles, MD  Cardiologist:  Arvid Right [facility]   Chief Complaint  Patient presents with  . other    Follow up Echo and Carotid. Meds reviewed by the pt. verbally. "doing well."      History of Present Illness:    Tami Sanders is a 69 y.o. female  past medical history of With history of smoking HTN Obesity Prior episodes of chest pain mild central chest discomfort,  mild shortness of breath.  Low potassium from HCTZ cardiac catheterization 2013, no significant coronary disease  Diabetes: HBA1C 10 in 05/2018 She presents for follow-up of chest pain, shortness of breath, palpitations  Last seen by myself with telemetry visit April 2020 At that time was doing well, was smoking 1/2 pack/day, Chronically low potassium, leg cramps  Was seen in the clinic May 2021 She reported having dizziness,   Recent results discussed with her in detail Echocardiogram June 10, 2019 reviewed normal ejection fraction, grade 2 diastolic dysfunction  Carotid ultrasound reviewed with no significant stenosis  At baseline, sedentary, legs weak Still smoking 1 pp week  Still some dizziness, when standing, moving head Has had vertigo before Feels this is like mild vertigo  Does not check sugars at home  EKG personally reviewed by myself on todays visit NSr rate 88, nonspecific T wave abn  Prior CV studies:   The following studies were reviewed today:  chest wall mass right lateral chest wall, scheduled to have lipoma resected  Previously seen in the emergency room for chest pain, Atypical in nature, workup negative  Stress test 2013 [facility]  Cardiac cath 2013: "normal"  cardiac catheterization from [facility] 2013. She reports at that time nose no significant  coronary disease   Past Medical History:  Diagnosis Date  . Anxiety   . Depression   . GERD (gastroesophageal reflux disease)   . Hyperlipidemia   . Hypertension    Past Surgical History:  Procedure Laterality Date  . CARDIAC CATHETERIZATION    . COLONOSCOPY    . MASS EXCISION Right 10/09/2016   Procedure: EXCISION MASS RIGHT LATERAL CHEST WALL;  Surgeon: Christene Lye, MD;  Location: [facility] ORS;  Service: General;  Laterality: Right;     Current Meds  Medication Sig  . amLODipine (NORVASC) 10 MG tablet Take 10 mg by mouth every morning.   Marland Kitchen atorvastatin (LIPITOR) 20 MG tablet Take 20 mg by mouth daily.  . hydrALAZINE (APRESOLINE) 25 MG tablet Take 1 tablet (25 mg) by mouth twice daily (Patient taking differently: Take 25 mg by mouth daily. )  . hydrochlorothiazide (HYDRODIURIL) 25 MG tablet Take 25 mg by mouth daily.  Marland Kitchen JARDIANCE 10 MG TABS tablet Take 10 mg by mouth daily.  . metFORMIN (GLUCOPHAGE-XR) 750 MG 24 hr tablet Take 1 tablet (750 mg) by mouth twice daily  . omeprazole (PRILOSEC) 20 MG capsule Take 20 mg by mouth daily.  . potassium chloride (K-DUR) 10 MEQ tablet Take 1 tablet (10 mEq total) by mouth daily.  . TRADJENTA 5 MG TABS tablet Take 5 mg by mouth daily.     Allergies:   Ace inhibitors   Social History   Tobacco Use  . Smoking status: Current Every Day Smoker  Packs/day: 0.50    Years: 42.00    Pack years: 21.00    Types: Cigarettes  . Smokeless tobacco: Never Used  Vaping Use  . Vaping Use: Never used  Substance Use Topics  . Alcohol use: No  . Drug use: No     Current Outpatient Medications on File Prior to Visit  Medication Sig Dispense Refill  . amLODipine (NORVASC) 10 MG tablet Take 10 mg by mouth every morning.     Marland Kitchen atorvastatin (LIPITOR) 20 MG tablet Take 20 mg by mouth daily.    . hydrALAZINE (APRESOLINE) 25 MG tablet Take 1 tablet (25 mg) by mouth twice daily (Patient taking differently: Take 25 mg by mouth daily. ) 60 tablet  3  . hydrochlorothiazide (HYDRODIURIL) 25 MG tablet Take 25 mg by mouth daily.    Marland Kitchen JARDIANCE 10 MG TABS tablet Take 10 mg by mouth daily.    . metFORMIN (GLUCOPHAGE-XR) 750 MG 24 hr tablet Take 1 tablet (750 mg) by mouth twice daily    . omeprazole (PRILOSEC) 20 MG capsule Take 20 mg by mouth daily.    . potassium chloride (K-DUR) 10 MEQ tablet Take 1 tablet (10 mEq total) by mouth daily. 30 tablet 11  . TRADJENTA 5 MG TABS tablet Take 5 mg by mouth daily.     No current facility-administered medications on file prior to visit.     Family Hx: The patient's family history includes Breast cancer in her mother and paternal aunt; Cancer in her father, mother, and paternal aunt.  ROS:   Please see the history of present illness.    Review of Systems  Constitutional: Negative.   HENT: Negative.   Respiratory: Negative.   Cardiovascular: Negative.   Gastrointestinal: Negative.   Musculoskeletal: Positive for joint pain.  Neurological: Positive for dizziness.  Psychiatric/Behavioral: Negative.   All other systems reviewed and are negative.     Labs/Other Tests and Data Reviewed:    Recent Labs: No results found for requested labs within last 8760 hours.   Recent Lipid Panel No results found for: CHOL, TRIG, HDL, CHOLHDL, LDLCALC, LDLDIRECT  Wt Readings from Last 3 Encounters:  06/15/19 195 lb 4 oz (88.6 kg)  05/04/19 200 lb 6 oz (90.9 kg)  04/18/18 212 lb (96.2 kg)     Exam:    Vital Signs: Vital signs may also be detailed in the HPI BP 136/80 (BP Location: Left Arm, Patient Position: Sitting, Cuff Size: Normal)   Pulse 88   Ht 5\' 6"  (1.676 m)   Wt 195 lb 4 oz (88.6 kg)   SpO2 96%   BMI 31.51 kg/m   Constitutional:  oriented to person, place, and time. No distress.  HENT:  Head: Grossly normal Eyes:  no discharge. No scleral icterus.  Neck: No JVD, no carotid bruits  Cardiovascular: Regular rate and rhythm, no murmurs appreciated Pulmonary/Chest: Clear to  auscultation bilaterally, no wheezes or rails Abdominal: Soft.  no distension.  no tenderness.  Musculoskeletal: Normal range of motion Neurological:  normal muscle tone. Coordination normal. No atrophy Skin: Skin warm and dry Psychiatric: normal affect, pleasant   ASSESSMENT & PLAN:    Centrilobular emphysema ([place]) We have encouraged her to continue to work on weaning her cigarettes and smoking cessation. She will continue to work on this and does not want any assistance with chantix.   Essential hypertension Blood pressure is well controlled on today's visit. No changes made to the medications.  Stable  Smoker As above  Smoking cessation recommended, strategies discussed  Palpitations Denies having significant palpitations Stable  Dizziness Sounds more vertigo than cardiovascular No further work-up at this time We reviewed echo and carotid results  Chest pain, unspecified type Currently with no symptoms of angina. No further workup at this time. Continue current medication regimen.   Total encounter time more than 25 minutes  Greater than 50% was spent in counseling and coordination of care with the patient   Disposition: Follow-up in 6 months   Signed, Ida Rogue, MD  06/15/2019 9:40 AM    [place] [facility] [id number], [place], [place] [id number]

## 2019-06-15 ENCOUNTER — Other Ambulatory Visit: Payer: Self-pay

## 2019-06-15 ENCOUNTER — Ambulatory Visit (INDEPENDENT_AMBULATORY_CARE_PROVIDER_SITE_OTHER): Payer: Medicare HMO | Admitting: Cardiovascular Disease

## 2019-06-15 ENCOUNTER — Encounter: Payer: Self-pay | Admitting: Cardiovascular Disease

## 2019-06-15 VITALS — BP 136/80 | HR 88 | Ht 66.0 in | Wt 195.2 lb

## 2019-06-15 DIAGNOSIS — J432 Centrilobular emphysema: Secondary | ICD-10-CM | POA: Diagnosis not present

## 2019-06-15 DIAGNOSIS — F17200 Nicotine dependence, unspecified, uncomplicated: Secondary | ICD-10-CM

## 2019-06-15 DIAGNOSIS — I10 Essential (primary) hypertension: Secondary | ICD-10-CM

## 2019-06-15 DIAGNOSIS — Z8639 Personal history of other endocrine, nutritional and metabolic disease: Secondary | ICD-10-CM | POA: Diagnosis not present

## 2019-06-15 DIAGNOSIS — R011 Cardiac murmur, unspecified: Secondary | ICD-10-CM | POA: Diagnosis not present

## 2019-06-15 DIAGNOSIS — E042 Nontoxic multinodular goiter: Secondary | ICD-10-CM

## 2019-06-15 DIAGNOSIS — R42 Dizziness and giddiness: Secondary | ICD-10-CM | POA: Diagnosis not present

## 2019-06-15 NOTE — Patient Instructions (Addendum)
We will request labs from [place] clinic, in particular HBA1C  Referral to endocrine in [place] for diabetes HBA1C 10, thyroid nodules - Dr. Honor Junes   We will order a thyroid u/s for nodules  Medication Instructions:  No changes  If you need a refill on your cardiac medications before your next appointment, please call your pharmacy.    Lab work: No new labs needed   If you have labs (blood work) drawn today and your tests are completely normal, you will receive your results only by: Marland Kitchen MyChart Message (if you have MyChart) OR . A paper copy in the mail If you have any lab test that is abnormal or we need to change your treatment, we will call you to review the results.   Testing/Procedures: Thyroid Ultrasound ordered for nodules.    Follow-Up: At [facility], you and your health needs are our priority.  As part of our continuing mission to provide you with exceptional heart care, we have created designated Provider Care Teams.  These Care Teams include your primary Cardiologist (physician) and Advanced Practice Providers (APPs -  Physician Assistants and Nurse Practitioners) who all work together to provide you with the care you need, when you need it.  . You will need a follow up appointment in 12 months   . Providers on your designated Care Team:   . Murray Hodgkins, NP . Christell Faith, PA-C . Marrianne Mood, PA-C  Any Other Special Instructions Will Be Listed Below (If Applicable).  For educational health videos Log in to : [email or web address] Or : [email or web address], password : [email or web address]

## 2019-06-22 ENCOUNTER — Institutional Professional Consult (permissible substitution): Payer: Medicare HMO | Admitting: Acute Care

## 2019-06-24 ENCOUNTER — Ambulatory Visit
Admission: RE | Admit: 2019-06-24 | Discharge: 2019-06-24 | Disposition: A | Discharge status: Home / Self Care | Payer: Medicare HMO | Source: Ambulatory Visit | Attending: Cardiovascular Disease | Admitting: Cardiovascular Disease

## 2019-06-24 ENCOUNTER — Other Ambulatory Visit: Payer: Self-pay

## 2019-06-24 DIAGNOSIS — E042 Nontoxic multinodular goiter: Secondary | ICD-10-CM | POA: Diagnosis present

## 2019-08-27 ENCOUNTER — Other Ambulatory Visit: Payer: Self-pay

## 2019-08-27 ENCOUNTER — Ambulatory Visit
Admission: RE | Admit: 2019-08-27 | Discharge: 2019-08-27 | Disposition: A | Discharge status: Home / Self Care | Payer: Medicare HMO | Source: Ambulatory Visit | Attending: Pulmonary Disease | Admitting: Pulmonary Disease

## 2019-08-27 ENCOUNTER — Encounter: Payer: Self-pay | Admitting: Pulmonary Disease

## 2019-08-27 ENCOUNTER — Ambulatory Visit (INDEPENDENT_AMBULATORY_CARE_PROVIDER_SITE_OTHER): Payer: Medicare HMO | Admitting: Pulmonary Disease

## 2019-08-27 ENCOUNTER — Ambulatory Visit
Admission: RE | Admit: 2019-08-27 | Discharge: 2019-08-27 | Disposition: A | Discharge status: Home / Self Care | Payer: Medicare HMO | Attending: Pulmonary Disease | Admitting: Pulmonary Disease

## 2019-08-27 VITALS — BP 142/88 | HR 86 | Temp 97.3°F | Ht 67.0 in | Wt 187.8 lb

## 2019-08-27 DIAGNOSIS — R059 Cough, unspecified: Secondary | ICD-10-CM

## 2019-08-27 DIAGNOSIS — R0683 Snoring: Secondary | ICD-10-CM

## 2019-08-27 DIAGNOSIS — R05 Cough: Secondary | ICD-10-CM | POA: Diagnosis present

## 2019-08-27 DIAGNOSIS — Z720 Tobacco use: Secondary | ICD-10-CM

## 2019-08-27 NOTE — Progress Notes (Signed)
[facility]  Chief Complaint  Patient presents with  . Consult    Patient has snoring, wakes up feeling like she is strangled for breath, restless takes her awhile to fall asleep. Wakes up 5-6 times a night mostly to go to bathroom. Sometimes wakes up coughing feeiling like something is in her throat.    Constitutional:  BP (!) 142/88 (BP Location: Left Arm, Patient Position: Sitting, Cuff Size: Normal)   Pulse 86   Temp (!) 97.3 F (36.3 C) (Temporal)   Ht 5\' 7"  (1.702 m)   Wt 187 lb 12.8 oz (85.2 kg)   SpO2 99%   BMI 29.41 kg/m   Past Medical History:  Diabetes, Thyroid nodule, HTN, HLD, GERD  Past Surgical History:  Her  has a past surgical history that includes Cardiac catheterization; Colonoscopy; and Mass excision (Right, 10/09/2016).  Brief Summary:  Tami Sanders is a 69 y.o. female with smoker with snoring and cough.      Subjective:  She is followed by cardiology for hypertension.  She was recently found to have diabetes.  She is a smoker.  She snores, and wakes up frequently at night.  There was concern she could have COPD and sleep apnea.  She goes to sleep at 9 pm.  She falls asleep after a while.  She wakes up 6 or 7 times to use the bathroom.  She gets out of bed at 9 am.  She feels tired in the morning.  She denies morning headache.  She does not use anything to help her fall sleep or stay awake.  She denies sleep walking, sleep talking, bruxism, or nightmares.  There is no history of restless legs.  She denies sleep hallucinations, sleep paralysis, or cataplexy.  The Epworth score is 6 out of 24.  She gets intermittent cough.  No wheeze or sputum.  She still smokes 4 cigarettes per day.  She knows she can quit if she wants to, but likes smoking.  She denies occupational exposure.  No history of pneumonia or exposure to TB.  No animal/bird exposure.   Physical Exam:   Appearance - well kempt   ENMT - no sinus  tenderness, no oral exudate, no LAN, Mallampati 4 airway, no stridor, 2+ tonsils, wears dentures  Respiratory - equal breath sounds bilaterally, no wheezing or rales  CV - s1s2 regular rate and rhythm, no murmurs  Ext - no clubbing, no edema  Skin - no rashes  Psych - normal mood and affect   Pulmonary testing:    Chest Imaging:    Sleep Tests:    Cardiac Tests:  Echo 06/10/19 >> EF 60 to 65%, grade 2 DD  Social History:  She  reports that she has been smoking cigarettes. She has a 10.50 pack-year smoking history. She has never used smokeless tobacco. She reports that she does not drink alcohol and does not use drugs.  Family History:  Her family history includes Breast cancer in her mother and paternal aunt; Cancer in her father, mother, and paternal aunt.    Discussion:  She is a smoker.  She has intermittent cough.  She has snoring, sleep disruption, apnea, and daytime sleepiness.  She has history of hypertension and diabetes.  It is possible she could have COPD and obstructive sleep apnea.  Assessment/Plan:   Cough with tobacco abuse. - discussed options to help with smoking cessation; she will consider these - will arrange for PFT and chest  xray to further assess for obstructive lung disease  Snoring with excessive daytime sleepiness. - will need to arrange for a home sleep study  Obesity. - discussed how weight can impact sleep and risk for sleep disordered breathing - discussed options to assist with weight loss: combination of diet modification, cardiovascular and strength training exercises  Cardiovascular risk. - had an extensive discussion regarding the adverse health consequences related to untreated sleep disordered breathing - specifically discussed the risks for hypertension, coronary artery disease, cardiac dysrhythmias, cerebrovascular disease, and diabetes - lifestyle modification discussed  Safe driving practices. - discussed how sleep disruption  can increase risk of accidents, particularly when driving - safe driving practices were discussed  Therapies for obstructive sleep apnea. - if the sleep study shows significant sleep apnea, then various therapies for treatment were reviewed: CPAP, oral appliance, and surgical interventions   Time Spent Involved in Patient Care on Day of Examination:  48 minutes  Follow up:  Patient Instructions  Will schedule chest xray and pulmonary function test  Will arrange for home sleep study  Will call to arrange for follow up after sleep study reviewed    Medication List:   Allergies as of 08/27/2019      Reactions   Ace Inhibitors Cough      Medication List       Accurate as of August 27, 2019 10:43 AM. If you have any questions, ask your nurse or doctor.        amLODipine 10 MG tablet Commonly known as: NORVASC Take 10 mg by mouth every morning.   atorvastatin 20 MG tablet Commonly known as: LIPITOR Take 20 mg by mouth daily.   hydrALAZINE 25 MG tablet Commonly known as: APRESOLINE Take 1 tablet (25 mg) by mouth twice daily What changed:   how much to take  how to take this  when to take this  additional instructions   hydrochlorothiazide 25 MG tablet Commonly known as: HYDRODIURIL Take 25 mg by mouth daily.   Jardiance 10 MG Tabs tablet Generic drug: empagliflozin Take 10 mg by mouth daily.   metFORMIN 750 MG 24 hr tablet Commonly known as: GLUCOPHAGE-XR Take 1 tablet (750 mg) by mouth twice daily   omeprazole 20 MG capsule Commonly known as: PRILOSEC Take 20 mg by mouth daily.   potassium chloride 10 MEQ tablet Commonly known as: KLOR-CON Take 1 tablet (10 mEq total) by mouth daily.   Tradjenta 5 MG Tabs tablet Generic drug: linagliptin Take 5 mg by mouth daily.       Signature:  Chesley Mires, MD [place] Pager - [phone] 08/27/2019, 10:43 AM

## 2019-08-27 NOTE — Patient Instructions (Signed)
Will schedule chest xray and pulmonary function test  Will arrange for home sleep study  Will call to arrange for follow up after sleep study reviewed

## 2019-08-31 ENCOUNTER — Telehealth: Payer: Self-pay | Admitting: Pulmonary Disease

## 2019-08-31 NOTE — Telephone Encounter (Signed)
Called and spoke with patient letting her know that her CXR results were normal. She expressed understanding. Nothing further needed at this time.

## 2019-08-31 NOTE — Telephone Encounter (Signed)
DG Chest 2 View  Result Date: 08/27/2019 CLINICAL DATA:  69 year old female with cough, worst at night. Chest pain and congestion. EXAM: CHEST - 2 VIEW COMPARISON:  Chest radiograph 06/20/2017 and earlier. FINDINGS: Lung volumes and mediastinal contours remain normal. Visualized tracheal air column is within normal limits. Both lungs appears stable since 2018 and clear aside from minimal chronic lower lobe scarring. No pneumothorax or pleural effusion. No acute osseous abnormality identified. Negative visible bowel gas pattern. IMPRESSION: No acute cardiopulmonary abnormality. Electronically Signed   By: Genevie Ann M.D.   On: 08/27/2019 15:58    Please let her know that her CXR is normal.

## 2019-10-20 ENCOUNTER — Encounter: Payer: Self-pay | Admitting: Pulmonary Disease

## 2020-05-26 ENCOUNTER — Encounter: Payer: Self-pay | Admitting: Cardiovascular Disease

## 2020-06-26 NOTE — Progress Notes (Signed)
Date:  06/27/2020   ID:  Tami Sanders, DOB 1950/06/13, MRN [id number]  Patient Location:  [place] [id number]   Provider location:   [facility], [place] office  PCP:  Marguerita Merles, MD  Cardiologist:  [facility]   Chief Complaint  Patient presents with   12 month follow up     "Doing well." Medications reviewed by the patient verbally.     History of Present Illness:    Tami Sanders is a 70 y.o. female  past medical history of With history of smoking HTN Obesity Prior episodes of chest pain mild central chest discomfort,  mild shortness of breath.  Low potassium from HCTZ cardiac catheterization 2013, no significant coronary disease  Diabetes: HBA1C 10 in 05/2018, 5.09 Feb 2020 She presents for follow-up of chest pain, shortness of breath, palpitations  Was seen in the clinic May 2021 She reported having dizziness,   In follow-up today reports feeling relatively well Smoking 2-3 a day, she has cut way back Chronically low potassium, leg cramps  Sedentary, legs weak Does not check sugars at home A1c dramatically improved, working with endocrinology  Takes hydralazine once a day, was previously on lisinopril but had a cough Was started on hydralazine twice a day but she has backed off to once a day  Echocardiogram June 10, 2019  normal ejection fraction, grade 2 diastolic dysfunction  Carotid ultrasound reviewed with no significant stenosis  EKG personally reviewed by myself on todays visit NSr rate 68 bpm no significant ST or T wave changes  Prior CV studies:   The following studies were reviewed today:  chest wall mass right lateral chest wall, scheduled to have lipoma resected  Previously seen in the emergency room for chest pain, Atypical in nature, workup negative  Stress test 2013 [facility]  Cardiac cath 2013: "normal"  cardiac catheterization from [facility] 2013. She reports at that time  nose no significant coronary disease   Past Medical History:  Diagnosis Date   Anxiety    Depression    GERD (gastroesophageal reflux disease)    Hyperlipidemia    Hypertension    Past Surgical History:  Procedure Laterality Date   CARDIAC CATHETERIZATION     COLONOSCOPY     MASS EXCISION Right 10/09/2016   Procedure: EXCISION MASS RIGHT LATERAL CHEST WALL;  Surgeon: Christene Lye, MD;  Location: [facility] ORS;  Service: General;  Laterality: Right;     Current Meds  Medication Sig   amLODipine (NORVASC) 10 MG tablet Take 10 mg by mouth every morning.    atorvastatin (LIPITOR) 20 MG tablet Take 20 mg by mouth daily.   hydrALAZINE (APRESOLINE) 25 MG tablet Take 25 mg by mouth daily.   JARDIANCE 10 MG TABS tablet Take 10 mg by mouth daily.   metFORMIN (GLUCOPHAGE-XR) 750 MG 24 hr tablet Take 1 tablet (750 mg) by mouth twice daily   metFORMIN (GLUCOPHAGE-XR) 750 MG 24 hr tablet Take 750 mg by mouth daily with breakfast.   omeprazole (PRILOSEC) 20 MG capsule Take 20 mg by mouth daily.   potassium chloride (K-DUR) 10 MEQ tablet Take 1 tablet (10 mEq total) by mouth daily.   spironolactone (ALDACTONE) 25 MG tablet Take 1 tablet by mouth daily.   TRADJENTA 5 MG TABS tablet Take 5 mg by mouth daily.     Allergies:   Ace inhibitors   Social History   Tobacco Use  Smoking status: Every Day    Packs/day: 0.25    Years: 42.00    Pack years: 10.50    Types: Cigarettes   Smokeless tobacco: Never   Tobacco comments:    4-5 cigarettes a day  Vaping Use   Vaping Use: Never used  Substance Use Topics   Alcohol use: No   Drug use: No     Current Outpatient Medications on File Prior to Visit  Medication Sig Dispense Refill   amLODipine (NORVASC) 10 MG tablet Take 10 mg by mouth every morning.      atorvastatin (LIPITOR) 20 MG tablet Take 20 mg by mouth daily.     hydrALAZINE (APRESOLINE) 25 MG tablet Take 25 mg by mouth daily.     JARDIANCE 10 MG TABS tablet Take 10 mg by  mouth daily.     metFORMIN (GLUCOPHAGE-XR) 750 MG 24 hr tablet Take 1 tablet (750 mg) by mouth twice daily     metFORMIN (GLUCOPHAGE-XR) 750 MG 24 hr tablet Take 750 mg by mouth daily with breakfast.     omeprazole (PRILOSEC) 20 MG capsule Take 20 mg by mouth daily.     potassium chloride (K-DUR) 10 MEQ tablet Take 1 tablet (10 mEq total) by mouth daily. 30 tablet 11   spironolactone (ALDACTONE) 25 MG tablet Take 1 tablet by mouth daily.     TRADJENTA 5 MG TABS tablet Take 5 mg by mouth daily.     No current facility-administered medications on file prior to visit.     Family Hx: The patient's family history includes Breast cancer in her mother and paternal aunt; Cancer in her father, mother, and paternal aunt.  ROS:   Please see the history of present illness.    Review of Systems  Constitutional: Negative.   HENT: Negative.    Respiratory: Negative.    Cardiovascular: Negative.   Gastrointestinal: Negative.   Musculoskeletal:  Positive for joint pain.  Neurological: Negative.   Psychiatric/Behavioral: Negative.    All other systems reviewed and are negative.    Labs/Other Tests and Data Reviewed:    Recent Labs: No results found for requested labs within last 8760 hours.   Recent Lipid Panel No results found for: CHOL, TRIG, HDL, CHOLHDL, LDLCALC, LDLDIRECT  Wt Readings from Last 3 Encounters:  06/27/20 179 lb 6 oz (81.4 kg)  08/27/19 187 lb 12.8 oz (85.2 kg)  06/15/19 195 lb 4 oz (88.6 kg)     Exam:    Vital Signs: Vital signs may also be detailed in the HPI BP 140/72 (BP Location: Left Arm, Patient Position: Sitting, Cuff Size: Normal)   Pulse 68   Ht 5\' 7"  (1.702 m)   Wt 179 lb 6 oz (81.4 kg)   BMI 28.09 kg/m   Constitutional:  oriented to person, place, and time. No distress.  HENT:  Head: Grossly normal Eyes:  no discharge. No scleral icterus.  Neck: No JVD, no carotid bruits  Cardiovascular: Regular rate and rhythm, no murmurs  appreciated Pulmonary/Chest: Clear to auscultation bilaterally, no wheezes or rails Abdominal: Soft.  no distension.  no tenderness.  Musculoskeletal: Normal range of motion Neurological:  normal muscle tone. Coordination normal. No atrophy Skin: Skin warm and dry Psychiatric: normal affect, pleasant   ASSESSMENT & PLAN:    Centrilobular emphysema (HCC) Smoking less We have encouraged her to continue to work on weaning her cigarettes and smoking cessation. She will continue to work on this and does not want any assistance with chantix.  Essential hypertension Hold the hydralazine Start losartan 25 mg daily BMP with dr. Honor Junes office   Smoker As above Smoking cessation recommended, strategies discussed  Palpitations Denies having significant palpitations Stable  Dizziness No further sx  Chest pain, unspecified type No chest pain sx Less stress   Total encounter time more than 25 minutes  Greater than 50% was spent in counseling and coordination of care with the patient    Signed, Ida Rogue, MD  06/27/2020 10:58 AM    [place] [facility] [address], [place], [place] [address]

## 2020-06-27 ENCOUNTER — Other Ambulatory Visit: Payer: Self-pay

## 2020-06-27 ENCOUNTER — Encounter: Payer: Self-pay | Admitting: Cardiovascular Disease

## 2020-06-27 ENCOUNTER — Ambulatory Visit (INDEPENDENT_AMBULATORY_CARE_PROVIDER_SITE_OTHER): Payer: Medicare HMO | Admitting: Cardiovascular Disease

## 2020-06-27 VITALS — BP 140/72 | HR 68 | Ht 67.0 in | Wt 179.4 lb

## 2020-06-27 DIAGNOSIS — I10 Essential (primary) hypertension: Secondary | ICD-10-CM

## 2020-06-27 DIAGNOSIS — R42 Dizziness and giddiness: Secondary | ICD-10-CM

## 2020-06-27 DIAGNOSIS — R002 Palpitations: Secondary | ICD-10-CM

## 2020-06-27 DIAGNOSIS — F17200 Nicotine dependence, unspecified, uncomplicated: Secondary | ICD-10-CM | POA: Diagnosis not present

## 2020-06-27 DIAGNOSIS — E1165 Type 2 diabetes mellitus with hyperglycemia: Secondary | ICD-10-CM

## 2020-06-27 DIAGNOSIS — J432 Centrilobular emphysema: Secondary | ICD-10-CM | POA: Diagnosis not present

## 2020-06-27 MED ORDER — LOSARTAN POTASSIUM 25 MG PO TABS: 25.0000 mg | ORAL_TABLET | Freq: Every day | ORAL | 3 refills | Status: DC

## 2020-06-27 NOTE — Patient Instructions (Addendum)
Lab work with Dr. Honor Junes  Medication Instructions:  STOP Hydralazine  START losartan 25 mg daily  If you need a refill on your cardiac medications before your next appointment, please call your pharmacy.   Lab work: No new labs needed  Testing/Procedures: No new testing needed   Follow-Up: At [facility], you and your health needs are our priority.  As part of our continuing mission to provide you with exceptional heart care, we have created designated Provider Care Teams.  These Care Teams include your primary Cardiologist (physician) and Advanced Practice Providers (APPs -  Physician Assistants and Nurse Practitioners) who all work together to provide you with the care you need, when you need it.  You will need a follow up appointment in 12 months  Providers on your designated Care Team:   Murray Hodgkins, NP Christell Faith, PA-C Marrianne Mood, PA-C Cadence Coleman, [place]  COVID-19 Vaccine Information can be found at: [email or web address].uk For questions related to vaccine distribution or appointments, please email [email or web address] or call [phone].

## 2020-11-02 ENCOUNTER — Ambulatory Visit: Payer: Medicare HMO

## 2020-11-05 ENCOUNTER — Other Ambulatory Visit: Payer: Self-pay

## 2020-11-05 ENCOUNTER — Emergency Department
Admission: EM | Admit: 2020-11-05 | Discharge: 2020-11-05 | Disposition: A | Discharge status: Home / Self Care | Payer: Medicare HMO | Attending: Emergency Medicine | Admitting: Emergency Medicine

## 2020-11-05 DIAGNOSIS — R110 Nausea: Secondary | ICD-10-CM | POA: Diagnosis not present

## 2020-11-05 DIAGNOSIS — Z79899 Other long term (current) drug therapy: Secondary | ICD-10-CM | POA: Insufficient documentation

## 2020-11-05 DIAGNOSIS — R42 Dizziness and giddiness: Secondary | ICD-10-CM | POA: Insufficient documentation

## 2020-11-05 DIAGNOSIS — F17210 Nicotine dependence, cigarettes, uncomplicated: Secondary | ICD-10-CM | POA: Diagnosis not present

## 2020-11-05 DIAGNOSIS — R55 Syncope and collapse: Secondary | ICD-10-CM

## 2020-11-05 DIAGNOSIS — I10 Essential (primary) hypertension: Secondary | ICD-10-CM | POA: Diagnosis not present

## 2020-11-05 LAB — BASIC METABOLIC PANEL
Anion gap: 7 (ref 5–15)
BUN: 10 mg/dL (ref 8–23)
CO2: 27 mmol/L (ref 22–32)
Calcium: 8.8 mg/dL — ABNORMAL LOW (ref 8.9–10.3)
Chloride: 106 mmol/L (ref 98–111)
Creatinine, Ser: 0.92 mg/dL (ref 0.44–1.00)
GFR, Estimated: >60 mL/min (ref >60)
Glucose, Bld: 99 mg/dL (ref 70–99)
Potassium: 3.9 mmol/L (ref 3.5–5.1)
Sodium: 140 mmol/L (ref 135–145)

## 2020-11-05 LAB — CBC
HCT: 42.1 % (ref 36.0–46.0)
Hemoglobin: 13.7 g/dL (ref 12.0–15.0)
MCH: 31.8 pg (ref 26.0–34.0)
MCHC: 32.5 g/dL (ref 30.0–36.0)
MCV: 97.7 fL (ref 80.0–100.0)
Platelets: 186 10*3/uL (ref 150–400)
RBC: 4.31 MIL/uL (ref 3.87–5.11)
RDW: 14.5 % (ref 11.5–15.5)
WBC: 6.3 10*3/uL (ref 4.0–10.5)
nRBC: 0 % (ref 0.0–0.2)

## 2020-11-05 MED ORDER — MECLIZINE HCL 25 MG PO TABS: 25.0000 mg | ORAL_TABLET | Freq: Three times a day (TID) | ORAL | 0 refills | Status: DC | PRN

## 2020-11-05 NOTE — ED Provider Notes (Signed)
[facility] Emergency Department Provider Note   ____________________________________________   Event Date/Time   First MD Initiated Contact with Patient 11/05/20 1131     (approximate)  I have reviewed the triage vital signs and the nursing notes.   HISTORY  Chief Complaint Headache and Near Syncope   HPI Tami Sanders is a 70 y.o. female who presents for intermittent dizziness   LOCATION: Generalized DURATION: 3 weeks prior to arrival TIMING: Intermittent SEVERITY: Severe QUALITY: Dizziness CONTEXT: Patient states she has a history of vertigo and 3 weeks prior to arrival began having intermittent dizziness with head position MODIFYING FACTORS: Holding her head in certain ways worsens this dizziness and it is relieved in other head positions ASSOCIATED SYMPTOMS: Mild nausea   Per medical record review, patient has history of vertigo          Past Medical History:  Diagnosis Date   Anxiety    Depression    GERD (gastroesophageal reflux disease)    Hyperlipidemia    Hypertension     Patient Active Problem List   Diagnosis Date Noted   Lipoma of chest wall 10/18/2016   Chest pain 09/20/2016   Palpitations 09/20/2016   Centrilobular emphysema ([place]) 09/20/2016   Essential hypertension 09/20/2016   Smoker 09/20/2016    Past Surgical History:  Procedure Laterality Date   CARDIAC CATHETERIZATION     COLONOSCOPY     MASS EXCISION Right 10/09/2016   Procedure: EXCISION MASS RIGHT LATERAL CHEST WALL;  Surgeon: Christene Lye, MD;  Location: [facility] ORS;  Service: General;  Laterality: Right;    Prior to Admission medications   Medication Sig Start Date End Date Taking? Authorizing Provider  meclizine (ANTIVERT) 25 MG tablet Take 1 tablet (25 mg total) by mouth 3 (three) times daily as needed for dizziness. 11/05/20  Yes Naaman Plummer, MD  amLODipine (NORVASC) 10 MG tablet Take 10 mg by mouth every morning.     [provider]  atorvastatin (LIPITOR) 20 MG tablet Take 20 mg by mouth daily. 03/24/19   [provider]  JARDIANCE 10 MG TABS tablet Take 10 mg by mouth daily. 04/23/19   [provider]  losartan (COZAAR) 25 MG tablet Take 1 tablet (25 mg total) by mouth daily. 06/27/20   Minna Merritts, MD  metFORMIN (GLUCOPHAGE-XR) 750 MG 24 hr tablet Take 1 tablet (750 mg) by mouth twice daily    [provider]  metFORMIN (GLUCOPHAGE-XR) 750 MG 24 hr tablet Take 750 mg by mouth daily with breakfast. 05/04/18   [provider]  omeprazole (PRILOSEC) 20 MG capsule Take 20 mg by mouth daily. 02/18/19   [provider]  potassium chloride (K-DUR) 10 MEQ tablet Take 1 tablet (10 mEq total) by mouth daily. 04/18/18 06/27/20  Minna Merritts, MD  spironolactone (ALDACTONE) 25 MG tablet Take 1 tablet by mouth daily. 02/29/20 02/28/21  [provider]  TRADJENTA 5 MG TABS tablet Take 5 mg by mouth daily. 04/23/19   [provider]    Allergies Ace inhibitors  Family History  Problem Relation Age of Onset   Cancer Mother        breast   Breast cancer Mother    Cancer Father        prostate   Cancer Paternal Aunt         breast   Breast cancer Paternal Aunt     Social History Social History   Tobacco Use  Smoking status: Every Day    Packs/day: 0.25    Years: 42.00    Pack years: 10.50    Types: Cigarettes   Smokeless tobacco: Never   Tobacco comments:    4-5 cigarettes a day  Vaping Use   Vaping Use: Never used  Substance Use Topics   Alcohol use: No   Drug use: No    Review of Systems Constitutional: No fever/chills Eyes: No visual changes. ENT: No sore throat. Cardiovascular: Denies chest pain. Respiratory: Denies shortness of breath. Gastrointestinal: No abdominal pain.  Endorses intermittent mild nausea, no vomiting.  No diarrhea. Genitourinary: Negative for dysuria. Musculoskeletal: Negative for acute arthralgias Skin:  Negative for rash. Neurological: Positive for vertigo.  Negative for headaches, weakness/numbness/paresthesias in any extremity Psychiatric: Negative for suicidal ideation/homicidal ideation   ____________________________________________   PHYSICAL EXAM:  VITAL SIGNS: ED Triage Vitals  Enc Vitals Group     BP 11/05/20 0915 138/77     Pulse Rate 11/05/20 0915 79     Resp 11/05/20 0915 20     Temp 11/05/20 0915 98.2 F (36.8 C)     Temp Source 11/05/20 0915 Oral     SpO2 11/05/20 0915 100 %     Weight 11/05/20 0915 179 lb (81.2 kg)     Height 11/05/20 0915 5\' 6"  (1.676 m)     Head Circumference --      Peak Flow --      Pain Score 11/05/20 0919 5     Pain Loc --      Pain Edu? --      Excl. in [place]? --    Constitutional: Alert and oriented. Well appearing and in no acute distress. Eyes: Conjunctivae are normal. PERRL. Head: Atraumatic. Nose: No congestion/rhinnorhea. Mouth/Throat: Mucous membranes are moist. Neck: No stridor Cardiovascular: Grossly normal heart sounds.  Good peripheral circulation. Respiratory: Normal respiratory effort.  No retractions. Gastrointestinal: Soft and nontender. No distention. Musculoskeletal: No obvious deformities Neurologic:  Normal speech and language. No gross focal neurologic deficits are appreciated. HINNTS exam positive for peripheral vertigo Skin:  Skin is warm and dry. No rash noted. Psychiatric: Mood and affect are normal. Speech and behavior are normal.  ____________________________________________   LABS (all labs ordered are listed, but only abnormal results are displayed)  Labs Reviewed  BASIC METABOLIC PANEL - Abnormal; Notable for the following components:      Result Value   Calcium 8.8 (*)    All other components within normal limits  CBC  URINALYSIS, ROUTINE W REFLEX MICROSCOPIC  CBG MONITORING, ED   PROCEDURES  Procedure(s) performed (including Critical  Care):  Procedures   ____________________________________________   INITIAL IMPRESSION / ASSESSMENT AND PLAN / ED COURSE  As part of my medical decision making, I reviewed the following data within the electronic medical record, if available:  Nursing notes reviewed and incorporated, Labs reviewed, EKG interpreted, Old chart reviewed, Radiograph reviewed and Notes from prior ED visits reviewed and incorporated        Based on History, Exam, and Findings, presentation not consistent with syncope, seizure, stroke, meningitis, symptomatic anemia (gastrointestinal bleed), Increased ICP (cerebral tumor/mass), ICH. Additionally, I have a low suspicion for AOM, labyrinthitis, or other infectious process. Tx: meclizine Reassessment: Prior to discharge symptoms controlled, patient well appearing. Disposition:  Discharge. Strict return precautions discussed w/ full understanding. Advise follow up with primary care provider within 24-48 hours.      ____________________________________________   FINAL CLINICAL IMPRESSION(S) / ED DIAGNOSES  Final diagnoses:  Vertigo  Near syncope     ED Discharge Orders          Ordered    meclizine (ANTIVERT) 25 MG tablet  3 times daily PRN        11/05/20 1139             Note:  This document was prepared using Dragon voice recognition software and may include unintentional dictation errors.    Naaman Plummer, MD 11/05/20 1200

## 2020-11-05 NOTE — ED Notes (Signed)
See triage note  presents with some dizziness and h/a  states sx's started about 2 weeks ago  no fever or trauma

## 2020-11-05 NOTE — ED Triage Notes (Signed)
Pt reports headache and lightheaded/dizziness x 2 weeks. Pt reports feelings of spinning with certain movements. Denies blurred, n/v. NAD noted at this time

## 2021-06-18 ENCOUNTER — Encounter: Payer: Self-pay | Admitting: Medical Oncology

## 2021-06-18 ENCOUNTER — Emergency Department
Admission: EM | Admit: 2021-06-18 | Discharge: 2021-06-18 | Disposition: A | Discharge status: Home / Self Care | Payer: Medicare HMO | Attending: Emergency Medicine | Admitting: Emergency Medicine

## 2021-06-18 DIAGNOSIS — M5431 Sciatica, right side: Secondary | ICD-10-CM | POA: Diagnosis not present

## 2021-06-18 DIAGNOSIS — S34109A Unspecified injury to unspecified level of lumbar spinal cord, initial encounter: Secondary | ICD-10-CM | POA: Diagnosis present

## 2021-06-18 DIAGNOSIS — S39012A Strain of muscle, fascia and tendon of lower back, initial encounter: Secondary | ICD-10-CM

## 2021-06-18 DIAGNOSIS — M5432 Sciatica, left side: Secondary | ICD-10-CM

## 2021-06-18 DIAGNOSIS — X58XXXA Exposure to other specified factors, initial encounter: Secondary | ICD-10-CM | POA: Diagnosis not present

## 2021-06-18 MED ORDER — LIDOCAINE 5 % EX PTCH: 1.0000 | MEDICATED_PATCH | Freq: Two times a day (BID) | CUTANEOUS | 0 refills | Status: AC

## 2021-06-18 MED ORDER — KETOROLAC TROMETHAMINE 30 MG/ML IJ SOLN
30.0000 mg | Freq: Once | INTRAMUSCULAR | Status: AC
Start: 2021-06-18 — End: 2021-06-18
  Administered 2021-06-18: 30 mg via INTRAMUSCULAR

## 2021-06-18 NOTE — Discharge Instructions (Addendum)
Use Tylenol for pain and fevers.  Up to 1000 mg per dose, up to 4 times per day.  Do not take more than 4000 mg of Tylenol/acetaminophen within 24 hours..  Use naproxen/Aleve for anti-inflammatory pain relief. Use up to 500mg every 12 hours. Do not take more frequently than this. Do not use other NSAIDs (ibuprofen, Advil) while taking this medication. It is safe to take Tylenol with this.   Please use lidocaine patches at your site of pain.  Apply 1 patch at a time, leave on for 12 hours, then remove for 12 hours.  12 hours on, 12 hours off.  Do not apply more than 1 patch at a time.  

## 2021-06-18 NOTE — ED Provider Notes (Signed)
[facility] Provider Note    Event Date/Time   First MD Initiated Contact with Patient 06/18/21 [phone]     (approximate)   History   Back Pain   HPI  Tami Sanders is a 71 y.o. female who presents to the ED for evaluation of Back Pain   Patient presents to the ED for evaluation of a few days left lower back discomfort rating down her bilateral hips and legs with associated paresthesias.  Denies any falls or injuries.  Denies fever.  Denies urinary or stool retention.  Reports she has most of her symptoms at night with a lot of tossing and turning and difficulty sleeping.   Physical Exam   Triage Vital Signs: ED Triage Vitals  Enc Vitals Group     BP 06/18/21 0813 (!) 154/82     Pulse Rate 06/18/21 0813 72     Resp 06/18/21 0813 20     Temp 06/18/21 0813 98.1 F (36.7 C)     Temp Source 06/18/21 0813 Oral     SpO2 06/18/21 0813 100 %     Weight 06/18/21 0813 194 lb (88 kg)     Height 06/18/21 0813 '5\' 6"'$  (1.676 m)     Head Circumference --      Peak Flow --      Pain Score 06/18/21 0837 8     Pain Loc --      Pain Edu? --      Excl. in [place]? --     Most recent vital signs: Vitals:   06/18/21 0813  BP: (!) 154/82  Pulse: 72  Resp: 20  Temp: 98.1 F (36.7 C)  SpO2: 100%    General: Awake, no distress.  CV:  Good peripheral perfusion.  Resp:  Normal effort.  Abd:  No distention.  MSK:  No deformity noted.  Mild upper localizing paraspinal lumbar tenderness to palpation.  No overlying signs of trauma or skin changes. Neuro:  No focal deficits appreciated. Cranial nerves II through XII intact 5/5 strength and sensation in all 4 extremities Other:     ED Results / Procedures / Treatments   Labs (all labs ordered are listed, but only abnormal results are displayed) Labs Reviewed - No data to display  EKG   RADIOLOGY   Official radiology report(s): No results found.  PROCEDURES and INTERVENTIONS:  Procedures  Medications   ketorolac (TORADOL) 30 MG/ML injection 30 mg (has no administration in time range)     IMPRESSION / MDM / ASSESSMENT AND PLAN / ED COURSE  I reviewed the triage vital signs and the nursing notes.  Differential diagnosis includes, but is not limited to, sciatica, cord compression, spinal stenosis, lumbar fracture  71 year old patient presents to the ED with bilateral sciatica for few days without trauma.  No red flag features.  No indications for emergent MRI.  She is requesting a shot to make her symptoms go away.  She takes NSAIDs at home and we discussed the risks and benefits of Toradol and she is agreeable with this.  We discussed exercises for sciatica and return precautions.      FINAL CLINICAL IMPRESSION(S) / ED DIAGNOSES   Final diagnoses:  Bilateral sciatica  Strain of lumbar region, initial encounter     Rx / DC Orders   ED Discharge Orders     None        Note:  This document was prepared using Dragon voice recognition software and may include  unintentional dictation errors.   Vladimir Crofts, MD 06/18/21 [phone]

## 2021-06-18 NOTE — ED Notes (Signed)
Dr Tamala Julian in triage assessing patient.

## 2021-06-18 NOTE — ED Triage Notes (Signed)
Pt reports that she has been having Bilt buttock pain that radiates down both legs since Thursday. Pt reports that this pain worsens at night.

## 2021-09-08 IMAGING — US US THYROID
1 series · 13 of 25 positions shown · non-contrast
Comparison: None.

CLINICAL DATA: Thyroid nodules.

EXAM:
THYROID ULTRASOUND
TECHNIQUE: Ultrasound examination of the thyroid gland and adjacent soft
tissues was performed.

[Series 1: us thyroid · 74 acquisitions, 13 frames shown]
[im 1/74]
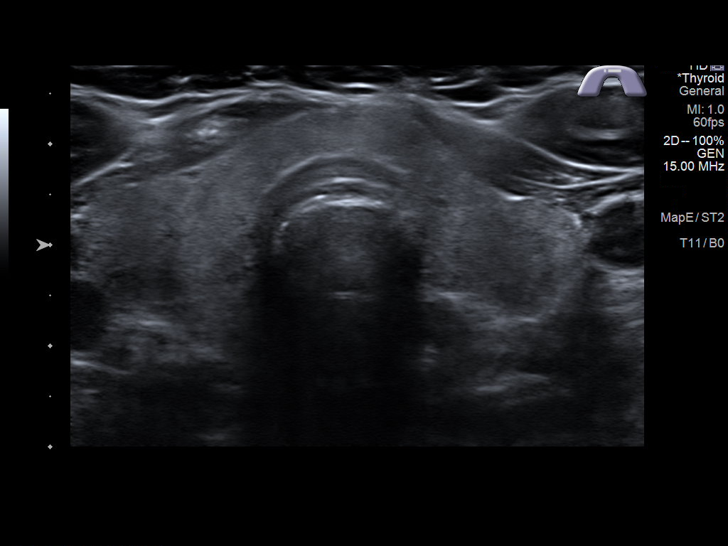
[im 7/74]
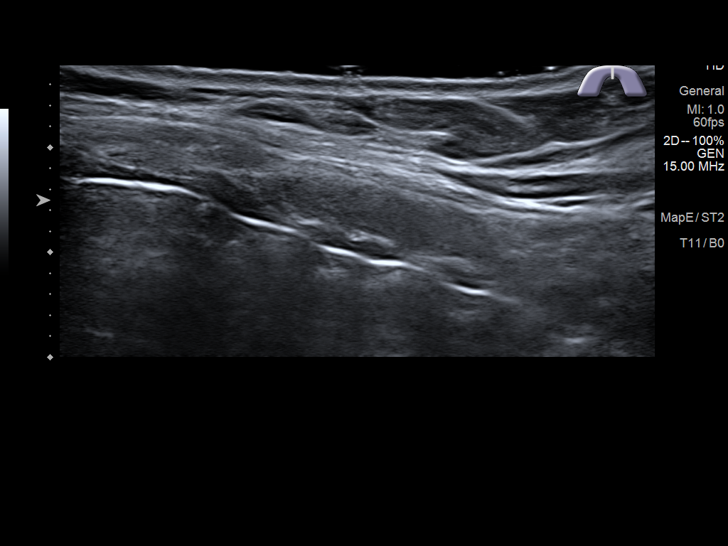
[im 13/74]
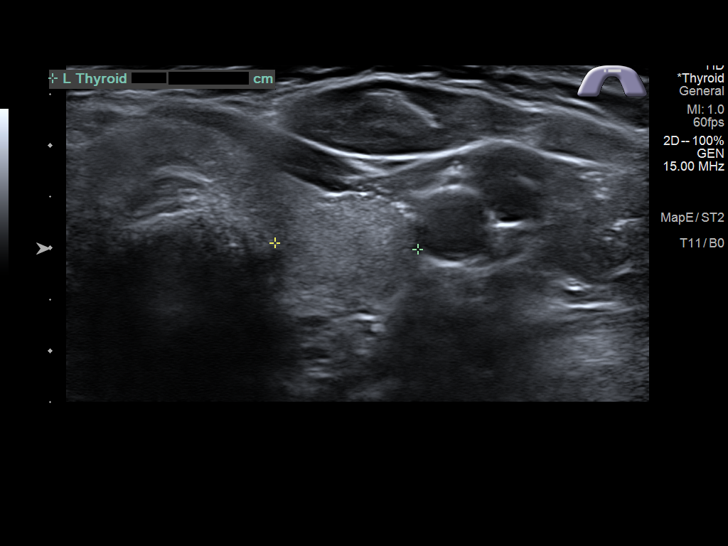
[im 19/74]
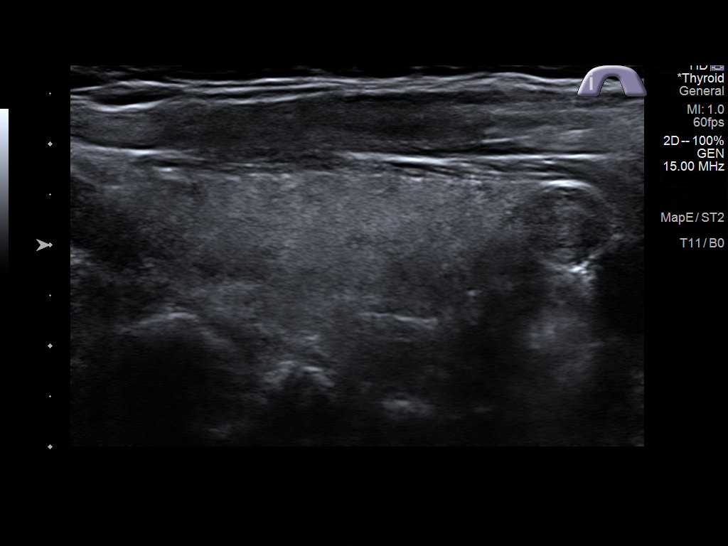
[im 25/74]
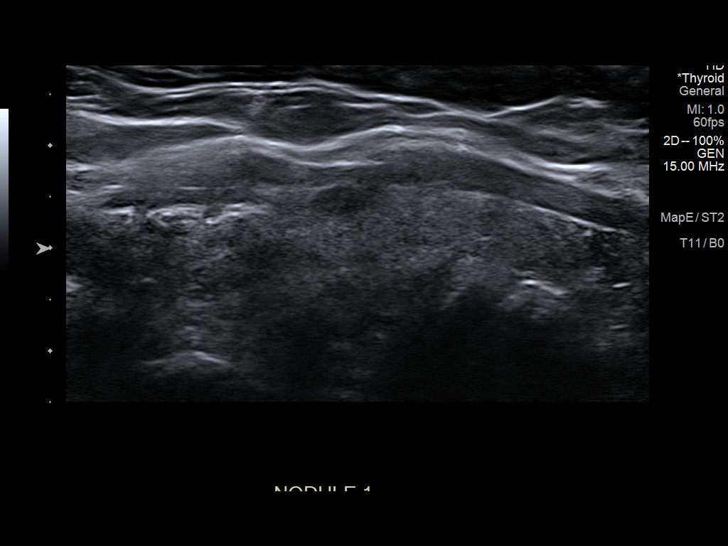
[im 31/74]
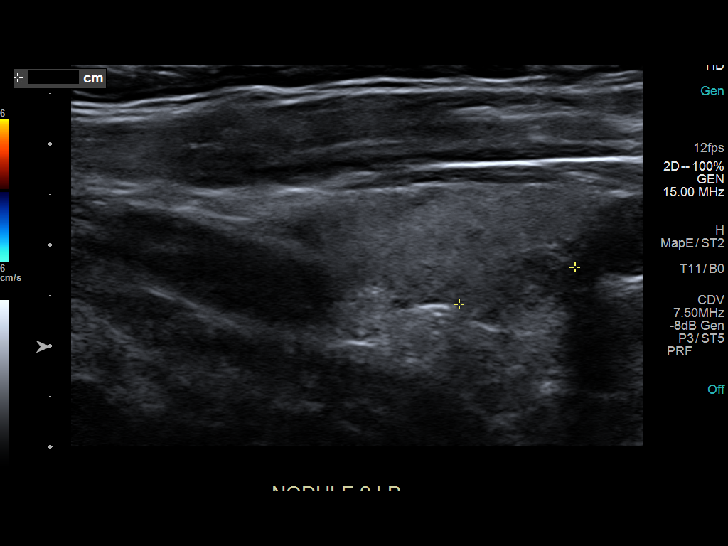
[im 37/74]
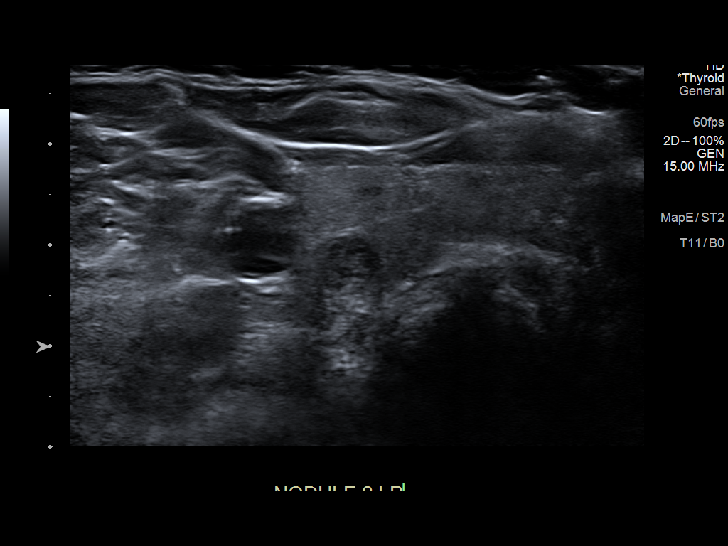
[im 43/74]
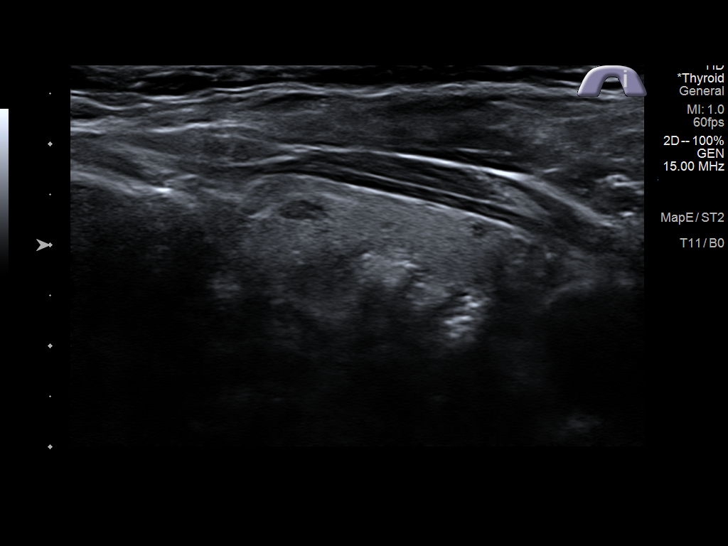
[im 49/74]
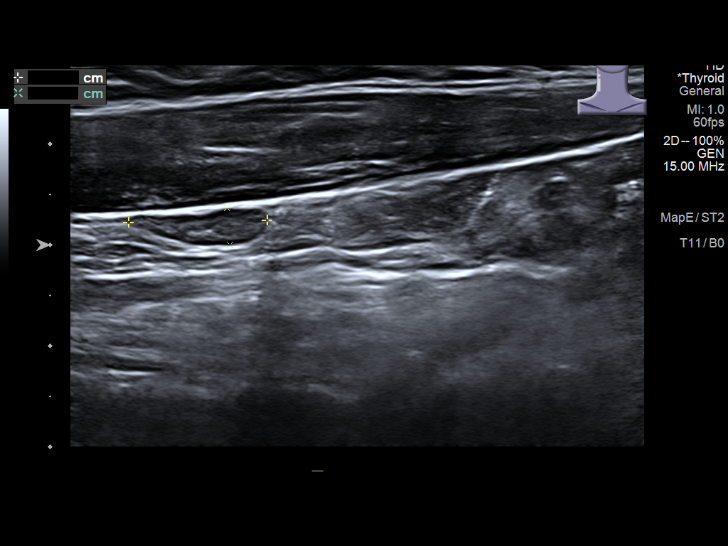
[im 55/74]
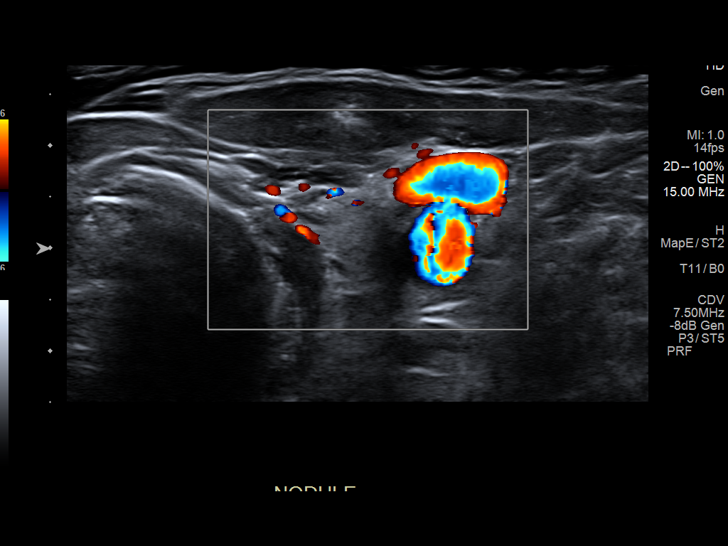
[im 61/74]
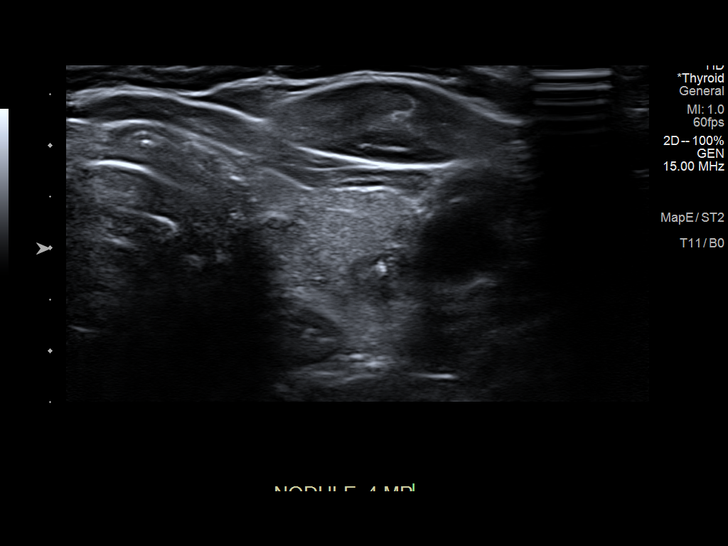
[im 67/74]
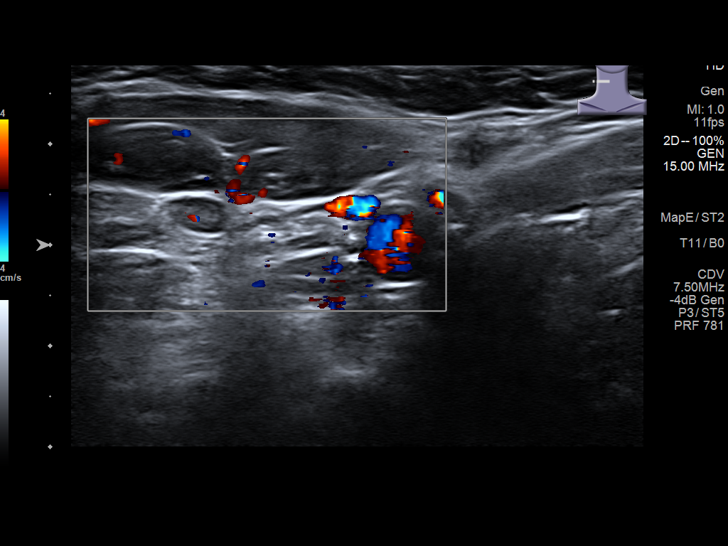
[im 74/74]
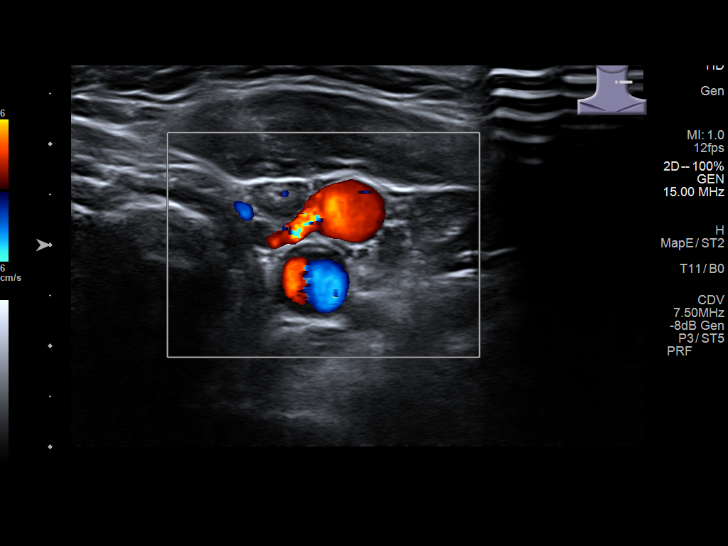

[13 of 25 positions shown; findings below may reference images not displayed]

FINDINGS: Parenchymal Echotexture: Moderately heterogenous

Isthmus: 0.6 cm

Right lobe: 3.7 x 1.9 x 1.7 cm

Left lobe: 3.2 x 1.5 x 1.4 cm

_________________________________________________________

Estimated total number of nodules >/= 1 cm: 1

Number of spongiform nodules >/=  2 cm not described below (TR1): 0

Number of mixed cystic and solid nodules >/= 1.5 cm not described
below (TR2): 0

_________________________________________________________

Scattered hypoechoic nodules in the right thyroid lobe.

Nodule #2 in the inferior right thyroid lobe measures 1.2 cm x 0.6 x
0.7 cm. This is a hypoechoic nodule without echogenic foci. This is
compatible with a TR 4 nodule. *Given size (>/= 1 - 1.4 cm) and
appearance, a follow-up ultrasound in 1 year should be considered
based on TI-RADS criteria.

Nodule # 4:

Location: Left; Mid

Maximum size: 0.7 cm; Other 2 dimensions: 0.6 x 0.7 cm

Composition: cannot determine (2)

Echogenicity: hypoechoic (2)

Shape: not taller-than-wide (0)

Margins: ill-defined (0)

Echogenic foci: macrocalcifications (1)

ACR TI-RADS total points: 5.

ACR TI-RADS risk category: TR4 (4-6 points).

ACR TI-RADS recommendations:

Given size (<0.9 cm) and appearance, this nodule does NOT meet
TI-RADS criteria for biopsy or dedicated follow-up.

_________________________________________________________
IMPRESSION: Thyroid tissue is heterogeneous with small nodules.

Nodule #2 is the largest nodule, measuring up to 1.2 cm. This nodule
meets criteria for 1 year follow-up.

The above is in keeping with the ACR TI-RADS recommendations - [HOSPITAL] 5494;[DATE].

## 2022-07-25 ENCOUNTER — Emergency Department: Payer: Medicare HMO

## 2022-07-25 ENCOUNTER — Other Ambulatory Visit: Payer: Self-pay

## 2022-07-25 ENCOUNTER — Emergency Department
Admission: EM | Admit: 2022-07-25 | Discharge: 2022-07-25 | Disposition: A | Discharge status: Home / Self Care | Payer: Medicare HMO | Attending: Emergency Medicine | Admitting: Emergency Medicine

## 2022-07-25 DIAGNOSIS — M5412 Radiculopathy, cervical region: Secondary | ICD-10-CM | POA: Diagnosis not present

## 2022-07-25 DIAGNOSIS — M25512 Pain in left shoulder: Secondary | ICD-10-CM | POA: Diagnosis present

## 2022-07-25 DIAGNOSIS — E119 Type 2 diabetes mellitus without complications: Secondary | ICD-10-CM | POA: Insufficient documentation

## 2022-07-25 DIAGNOSIS — I10 Essential (primary) hypertension: Secondary | ICD-10-CM | POA: Insufficient documentation

## 2022-07-25 HISTORY — DX: Type 2 diabetes mellitus without complications: E11.9

## 2022-07-25 MED ORDER — PREDNISONE 10 MG (21) PO TBPK: ORAL_TABLET | ORAL | 0 refills | Status: DC

## 2022-07-25 MED ORDER — HYDROCODONE-ACETAMINOPHEN 5-325 MG PO TABS: 1.0000 | ORAL_TABLET | Freq: Four times a day (QID) | ORAL | 0 refills | Status: AC | PRN

## 2022-07-25 NOTE — ED Provider Notes (Signed)
[facility] Provider Note    Event Date/Time   First MD Initiated Contact with Patient 07/25/22 0848     (approximate)   History   Shoulder Pain and Arm Pain   HPI  Tami Sanders is a 72 y.o. female with past medical history of hypertension, GERD, depression, anxiety, type 2 diabetes  and as listed in EMR presents to the emergency department for treatment and evaluation of left shoulder and arm pain started about 2 weeks ago.  Pain occasionally goes down her arm into her fingers.  No injury.      Physical Exam   Triage Vital Signs: ED Triage Vitals  Encounter Vitals Group     BP 07/25/22 0823 (!) 148/83     Systolic BP Percentile --      Diastolic BP Percentile --      Pulse Rate 07/25/22 0823 75     Resp 07/25/22 0823 16     Temp 07/25/22 0823 98.4 F (36.9 C)     Temp Source 07/25/22 0823 Oral     SpO2 07/25/22 0823 96 %     Weight 07/25/22 0823 192 lb (87.1 kg)     Height 07/25/22 0823 5\' 7"  (1.702 m)     Head Circumference --      Peak Flow --      Pain Score 07/25/22 0822 5     Pain Loc --      Pain Education --      Exclude from Growth Chart --     Most recent vital signs: Vitals:   07/25/22 0823  BP: (!) 148/83  Pulse: 75  Resp: 16  Temp: 98.4 F (36.9 C)  SpO2: 96%    General: Awake, no distress.   CV:  Good peripheral perfusion.  Resp:  Normal effort.  Abd:  No distention.  Other:  Grip strength is equal.  No focal midline tenderness of the cervical spine.   ED Results / Procedures / Treatments   Labs (all labs ordered are listed, but only abnormal results are displayed) Labs Reviewed - No data to display   EKG     RADIOLOGY  Image and radiology report reviewed and interpreted by me. Radiology report consistent with the same.  CT of the cervical spine shows no acute abnormality.  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  Differential diagnosis includes, but is not limited to, cervical radiculopathy, tendinitis, carpal tunnel syndrome, ulnar nerve impingement  Patient's presentation is most consistent with acute illness / injury with system symptoms.  72 year old female presenting to the emergency department for treatment and evaluation of left arm pain that shoot down into her fingers occasionally.  Symptoms started about 2 weeks ago.  No specific injury.  See HPI for further details.  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  She will be placed on prednisone and given a short course of hydrocodone.  She is to call and schedule follow-up appointment with neurology.  If symptoms change or worsen she is to return to the emergency department if she is unable to schedule an appointment with primary care  or the specialist.      FINAL CLINICAL IMPRESSION(S) / ED DIAGNOSES   Final diagnoses:  Cervical radiculopathy     Rx / DC Orders   ED Discharge Orders          Ordered    predniSONE (STERAPRED UNI-PAK 21 TAB) 10 MG (21) TBPK tablet        07/25/22 1228    HYDROcodone-acetaminophen (NORCO/VICODIN) 5-325 MG tablet  Every 6 hours PRN        07/25/22 1228             Note:  This document was prepared using Dragon voice recognition software and may include unintentional dictation errors.   Chinita Pester, FNP 07/26/22 2312    Merwyn Katos, MD 08/03/22 [phone]

## 2022-07-25 NOTE — ED Triage Notes (Signed)
Pt to ED POV for L shoulder and arm pain that started 2 weeks ago. Pain is throbbing. Worse since 2 days ago. At times L fingers go numb. Denies known injury. Full ROM.

## 2022-07-25 NOTE — ED Notes (Signed)
Patient Alert and oriented to baseline. Stable and ambulatory to baseline. Patient verbalized understanding of the discharge instructions.  Patient belongings were taken by the patient.   

## 2022-08-01 ENCOUNTER — Other Ambulatory Visit: Payer: Self-pay | Admitting: Physician Assistant

## 2022-08-01 DIAGNOSIS — R200 Anesthesia of skin: Secondary | ICD-10-CM

## 2022-08-01 DIAGNOSIS — M5412 Radiculopathy, cervical region: Secondary | ICD-10-CM

## 2022-08-01 DIAGNOSIS — M542 Cervicalgia: Secondary | ICD-10-CM

## 2022-08-01 DIAGNOSIS — R29898 Other symptoms and signs involving the musculoskeletal system: Secondary | ICD-10-CM

## 2022-08-13 ENCOUNTER — Encounter: Payer: Self-pay | Admitting: Physician Assistant

## 2022-08-18 ENCOUNTER — Other Ambulatory Visit: Payer: Medicare HMO

## 2022-08-21 ENCOUNTER — Encounter: Payer: Self-pay | Admitting: Physician Assistant

## 2022-08-22 ENCOUNTER — Ambulatory Visit
Admission: RE | Admit: 2022-08-22 | Discharge: 2022-08-22 | Disposition: A | Discharge status: Home / Self Care | Payer: Medicare HMO | Source: Ambulatory Visit | Attending: Physician Assistant | Admitting: Physician Assistant

## 2022-08-22 DIAGNOSIS — R29898 Other symptoms and signs involving the musculoskeletal system: Secondary | ICD-10-CM

## 2022-08-22 DIAGNOSIS — M5412 Radiculopathy, cervical region: Secondary | ICD-10-CM

## 2022-08-22 DIAGNOSIS — R200 Anesthesia of skin: Secondary | ICD-10-CM

## 2022-08-22 DIAGNOSIS — M542 Cervicalgia: Secondary | ICD-10-CM

## 2022-11-23 NOTE — Progress Notes (Unsigned)
Referring Physician:  Elijah Birk, MD [address] [place],  [place] [address]  Primary Physician:  [facility]  History of Present Illness: 11/27/2022 Tami Sanders is here today with a chief complaint of left arm pain.  She also has neck pain.  This has been ongoing for 5 months.  Her pain has been worsened over that time.  Laying down makes it worse.  Turning her head to the left and driving makes it worse.  Nothing really helps.  She has had injections, which only helped for short period.  She also tried physical therapy for several visits, but it worsened her pain.  She is having some weakness in her left arm. Bowel/Bladder Dysfunction: none  Conservative measures:  Physical therapy:  has participated in at [facility] from 09/06/22 to 09/20/22 Multimodal medical therapy including regular antiinflammatories:  prednisone, gabapentin, ibuprofen  Injections: C6-7 TFESI on 10/11/2022  C5-6 TFESI 11/01/2022   Past Surgery: no  Tami Sanders has no symptoms of cervical myelopathy.  The symptoms are causing a significant impact on the patient's life.   I have utilized the care everywhere function in epic to review the outside records available from external health systems.  Review of Systems:  A 10 point review of systems is negative, except for the pertinent positives and negatives detailed in the HPI.  Past Medical History: Past Medical History:  Diagnosis Date   Anxiety    Depression    Diabetes mellitus without complication (HCC)    GERD (gastroesophageal reflux disease)    Hyperlipidemia    Hypertension     Past Surgical History: Past Surgical History:  Procedure Laterality Date   CARDIAC CATHETERIZATION     COLONOSCOPY     MASS EXCISION Right 10/09/2016   Procedure: EXCISION MASS RIGHT LATERAL CHEST WALL;  Surgeon: Kieth Brightly, MD;  Location: [facility] ORS;  Service: General;  Laterality: Right;     Allergies: Allergies as of 11/27/2022 - Review Complete 11/27/2022  Allergen Reaction Noted   Ace inhibitors Cough 05/05/2019    Medications:  Current Outpatient Medications:    amLODipine (NORVASC) 10 MG tablet, Take 10 mg by mouth every morning. , Disp: , Rfl:    atorvastatin (LIPITOR) 20 MG tablet, Take 20 mg by mouth daily., Disp: , Rfl:    ibuprofen (ADVIL) 200 MG tablet, Take 200 mg by mouth every 4 (four) hours as needed., Disp: , Rfl:    JARDIANCE 10 MG TABS tablet, Take 10 mg by mouth daily., Disp: , Rfl:    losartan (COZAAR) 25 MG tablet, Take 1 tablet (25 mg total) by mouth daily., Disp: 90 tablet, Rfl: 3   meclizine (ANTIVERT) 25 MG tablet, Take 1 tablet (25 mg total) by mouth 3 (three) times daily as needed for dizziness., Disp: 30 tablet, Rfl: 0   metFORMIN (GLUCOPHAGE-XR) 750 MG 24 hr tablet, Take 1 tablet (750 mg) by mouth twice daily, Disp: , Rfl:    omeprazole (PRILOSEC) 20 MG capsule, Take 20 mg by mouth daily., Disp: , Rfl:    potassium chloride (K-DUR) 10 MEQ tablet, Take 1 tablet (10 mEq total) by mouth daily., Disp: 30 tablet, Rfl: 11   spironolactone (ALDACTONE) 25 MG tablet, Take 1 tablet by mouth daily., Disp: , Rfl:   Social History: Social History   Tobacco Use   Smoking status: Every Day    Current packs/day: 0.25    Average packs/day: 0.3 packs/day for 42.0 years (10.5 ttl  pk-yrs)    Types: Cigarettes   Smokeless tobacco: Never   Tobacco comments:    4-5 cigarettes a day  Vaping Use   Vaping status: Never Used  Substance Use Topics   Alcohol use: No   Drug use: No    Family Medical History: Family History  Problem Relation Age of Onset   Cancer Mother        breast   Breast cancer Mother    Cancer Father        prostate   Cancer Paternal Aunt         breast   Breast cancer Paternal Aunt     Physical Examination: Vitals:   11/27/22 1438  BP: 136/84    General: Patient is in no apparent distress. Attention to examination is  appropriate.  Neck:   Supple.  Full range of motion.  Respiratory: Patient is breathing without any difficulty.   NEUROLOGICAL:     Awake, alert, oriented to person, place, and time.  Speech is clear and fluent.   Cranial Nerves: Pupils equal round and reactive to light.  Facial tone is symmetric.  Facial sensation is symmetric. Shoulder shrug is symmetric. Tongue protrusion is midline.  There is no pronator drift.  Strength: Side Biceps Triceps Deltoid Interossei Grip Wrist Ext. Wrist Flex.  R 5 5 5 5 5 5 5   L 5 4- 5 5 5 5 5    Side Iliopsoas Quads Hamstring PF DF EHL  R 5 5 5 5 5 5   L 5 5 5 5 5 5    Reflexes are 1+ and symmetric at the biceps, triceps, brachioradialis, patella and achilles.   Hoffman's is absent.   Bilateral upper and lower extremity sensation is intact to light touch.    No evidence of dysmetria noted.  Gait is normal.     Medical Decision Making  Imaging: MR C spine 08/22/2022 Disc levels:   C2-C3: There is a shallow protrusion and mild uncovertebral and facet arthropathy resulting in mild spinal canal stenosis without significant neural foraminal stenosis   C3-C4: There is a small central protrusion and mild uncovertebral and facet arthropathy resulting in mild spinal canal stenosis without significant neural foraminal stenosis   C4-C5: Minimal uncovertebral and facet arthropathy without significant spinal canal or neural foraminal stenosis   C5-C6: Minimal uncovertebral and facet arthropathy without significant spinal canal or neural foraminal stenosis   C6-C7: There is a disc bulge and mild uncovertebral and facet arthropathy resulting in mild-to-moderate spinal canal stenosis and severe left and mild right neural foraminal stenosis   C7-T1: No significant spinal canal or neural foraminal stenosis.   IMPRESSION: 1. Disc bulge and mild uncovertebral and facet arthropathy at C6-C7 resulting in mild-to-moderate spinal canal stenosis and  severe left and mild right neural foraminal stenosis. 2. Otherwise overall mild degenerative changes throughout the remainder of the cervical spine without other high-grade spinal canal or neural foraminal stenosis.     Electronically Signed   By: Lesia Hausen M.D.   On: 08/27/2022 15:40  I have personally reviewed the images and agree with the above interpretation.  Assessment and Plan: Tami Sanders is a pleasant 72 y.o. 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  She has weakness in her left triceps.  She has objective weakness, and has not had improvement with conservative management to this point.  Given her objective weakness, there is no role for further conservative management.  I recommend C6-7 anterior cervical discectomy and  fusion.  I discussed the planned procedure at length with the patient, including the risks, benefits, alternatives, and indications. The risks discussed include but are not limited to bleeding, infection, need for reoperation, spinal fluid leak, stroke, vision loss, anesthetic complication, coma, paralysis, and even death. We also discussed the possibility of post-operative dysphagia, vocal cord paralysis, and the risk of adjacent segment disease in the future. I also described in detail that improvement was not guaranteed.  The patient expressed understanding of these risks, and asked that we proceed with surgery. I described the surgery in layman's terms, and gave ample opportunity for questions, which were answered to the best of my ability.  I spent a total of 30 minutes in this patient's care today. This time was spent reviewing pertinent records including imaging studies, obtaining and confirming history, performing a directed evaluation, formulating and discussing my recommendations, and documenting the visit within the medical record.      Thank you for involving me in the care of this patient.      Crimson Beer K. Myer Haff  MD, [facility]

## 2022-11-27 ENCOUNTER — Ambulatory Visit (INDEPENDENT_AMBULATORY_CARE_PROVIDER_SITE_OTHER): Payer: Medicare HMO | Admitting: Neurosurgery

## 2022-11-27 ENCOUNTER — Encounter: Payer: Self-pay | Admitting: Neurosurgery

## 2022-11-27 ENCOUNTER — Other Ambulatory Visit: Payer: Self-pay

## 2022-11-27 VITALS — BP 136/84 | Ht 67.0 in | Wt 185.0 lb

## 2022-11-27 DIAGNOSIS — Z7689 Persons encountering health services in other specified circumstances: Secondary | ICD-10-CM

## 2022-11-27 DIAGNOSIS — Z01818 Encounter for other preprocedural examination: Secondary | ICD-10-CM

## 2022-11-27 DIAGNOSIS — M4802 Spinal stenosis, cervical region: Secondary | ICD-10-CM

## 2022-11-27 DIAGNOSIS — M5412 Radiculopathy, cervical region: Secondary | ICD-10-CM

## 2022-11-27 DIAGNOSIS — R29898 Other symptoms and signs involving the musculoskeletal system: Secondary | ICD-10-CM

## 2022-11-27 NOTE — Addendum Note (Signed)
Addended by: Venetia Night on: 11/27/2022 03:42 PM   Modules accepted: Orders

## 2022-11-27 NOTE — Patient Instructions (Signed)
Please see below for information in regards to your upcoming surgery:   Planned surgery: C6-7 anterior cervical discectomy and fusion   Surgery date: 01/07/23 at [facility] (Medical Mall: [address], [place], [place] [address]) - you will find out your arrival time the business day before your surgery.   Pre-op appointment at [facility] Pre-admit Testing: we will call you with a date/time for this. If you are scheduled for an in person appointment, Pre-admit Testing is located on the first floor of the [facility] building, [address], [address]. Please bring all prescriptions in the original prescription bottles to your appointment. During this appointment, they will advise you which medications you can take the morning of surgery, and which medications you will need to hold for surgery. Labs (such as blood work, EKG) may be done at your pre-op appointment. You are not required to fast for these labs. Should you need to change your pre-op appointment, please call Pre-admit testing at [phone].     Diabetes/weight loss medications:  Empagliflozin (Jardiance): hold 3 full days prior to surgery Metformin: hold for 2 days prior to surgery     Surgical clearance: we will send a clearance form to [facility] (if you change providers to [facility] or somewhere else, let us know so we can send the form there instead). They may wish to see you in their office prior to signing the clearance form. If so, they may call you to schedule an appointment.     NSAIDS (Non-steroidal anti-inflammatory drugs): because you are having a fusion, please avoid taking any NSAIDS (examples: ibuprofen, motrin, aleve, naproxen, meloxicam, diclofenac) for 3 months after surgery. Celebrex is an exception and is OK to take, if prescribed. Tylenol is not an NSAID.    Common restrictions after surgery: No bending, lifting, or twisting ("BLT"). Avoid lifting  objects heavier than 10 pounds for the first 6 weeks after surgery. Where possible, avoid household activities that involve lifting, bending, reaching, pushing, or pulling such as laundry, vacuuming, grocery shopping, and childcare. Try to arrange for help from friends and family for these activities while you heal. Do not drive while taking prescription pain medication. Weeks 6 through 12 after surgery: avoid lifting more than 25 pounds.    X-rays after surgery: Because you are having a fusion: for appointments after your 2 week follow-up: please arrive at the [facility] outpatient imaging center ([address], [address], [place]) or [facility] one hour prior to your appointment for x-rays. This applies to every appointment after your 2 week follow-up. Failure to do so may result in your appointment being rescheduled.   How to contact us:  If you have any questions/concerns before or after surgery, you can reach [place] at [phone], or you can send a mychart message. We can be reached by phone or mychart 8am-4pm, Monday-Friday.  *Please note: Calls after 4pm are forwarded to a third party answering service. Mychart messages are not routinely monitored during evenings, weekends, and holidays. Please call our office to contact the answering service for urgent concerns during non-business hours.   Appointments/FMLA & disability paperwork: Joycelyn Rua, & Flonnie Hailstone Registered Nurse/Surgery scheduler: Royston Cowper Medical Assistants: Nash Mantis Physician Assistants: Joan Flores, PA-C, Manning Charity, PA-C & Drake Leach, PA-C Surgeons: Venetia Night, MD & Ernestine Mcmurray, MD

## 2022-11-27 NOTE — Addendum Note (Signed)
Addended by: Sharlot Gowda on: 11/27/2022 03:40 PM   Modules accepted: Orders

## 2022-12-28 ENCOUNTER — Other Ambulatory Visit: Payer: Self-pay

## 2022-12-28 ENCOUNTER — Encounter
Admission: RE | Admit: 2022-12-28 | Discharge: 2022-12-28 | Disposition: A | Discharge status: Home / Self Care | Payer: Medicare HMO | Source: Ambulatory Visit | Attending: Neurosurgery | Admitting: Neurosurgery

## 2022-12-28 VITALS — BP 170/95 | HR 76 | Temp 97.8°F | Resp 18 | Ht 64.0 in | Wt 195.1 lb

## 2022-12-28 DIAGNOSIS — Z01812 Encounter for preprocedural laboratory examination: Secondary | ICD-10-CM | POA: Diagnosis present

## 2022-12-28 DIAGNOSIS — I10 Essential (primary) hypertension: Secondary | ICD-10-CM | POA: Insufficient documentation

## 2022-12-28 DIAGNOSIS — R002 Palpitations: Secondary | ICD-10-CM | POA: Diagnosis not present

## 2022-12-28 DIAGNOSIS — Z01818 Encounter for other preprocedural examination: Secondary | ICD-10-CM | POA: Diagnosis not present

## 2022-12-28 DIAGNOSIS — Z01810 Encounter for preprocedural cardiovascular examination: Secondary | ICD-10-CM | POA: Diagnosis present

## 2022-12-28 HISTORY — DX: Palpitations: R00.2

## 2022-12-28 HISTORY — DX: Centrilobular emphysema: J43.2

## 2022-12-28 HISTORY — DX: Benign lipomatous neoplasm of skin and subcutaneous tissue of trunk: D17.1

## 2022-12-28 LAB — CBC
HCT: 40.9 % (ref 36.0–46.0)
Hemoglobin: 13.6 g/dL (ref 12.0–15.0)
MCH: 32.7 pg (ref 26.0–34.0)
MCHC: 33.3 g/dL (ref 30.0–36.0)
MCV: 98.3 fL (ref 80.0–100.0)
Platelets: 208 10*3/uL (ref 150–400)
RBC: 4.16 MIL/uL (ref 3.87–5.11)
RDW: 14.9 % (ref 11.5–15.5)
WBC: 5.6 10*3/uL (ref 4.0–10.5)
nRBC: 0 % (ref 0.0–0.2)

## 2022-12-28 LAB — BASIC METABOLIC PANEL
Anion gap: 12 (ref 5–15)
BUN: 11 mg/dL (ref 8–23)
CO2: 19 mmol/L — ABNORMAL LOW (ref 22–32)
Calcium: 7.8 mg/dL — ABNORMAL LOW (ref 8.9–10.3)
Chloride: 98 mmol/L (ref 98–111)
Creatinine, Ser: 0.67 mg/dL (ref 0.44–1.00)
GFR, Estimated: >60 mL/min (ref >60)
Glucose, Bld: 102 mg/dL — ABNORMAL HIGH (ref 70–99)
Potassium: 3.8 mmol/L (ref 3.5–5.1)
Sodium: 129 mmol/L — ABNORMAL LOW (ref 135–145)

## 2022-12-28 LAB — SURGICAL PCR SCREEN
MRSA, PCR: NEGATIVE
Staphylococcus aureus: NEGATIVE

## 2022-12-28 NOTE — Patient Instructions (Addendum)
Your procedure is scheduled on:  Monday , January 6  Report to the Registration Desk on the 1st floor of the [facility] To find out your arrival time, please call [phone] between 1PM - 3PM on:  Friday January 4  If your arrival time is 6:00 am, do not arrive before that time as the Medical Mall entrance doors do not open until 6:00 am.  REMEMBER: Instructions that are not followed completely may result in serious medical risk, up to and including death; or upon the discretion of your surgeon and anesthesiologist your surgery may need to be rescheduled.  Do not eat food after midnight the night before surgery.  No gum chewing or hard candies.  You may however, drink WATER up to 2 hours before you are scheduled to arrive for your surgery. Do not drink anything within 2 hours of your scheduled arrival time.   One week prior to surgery: Monday December 30  Stop Anti-inflammatories (NSAIDS) such as Advil, Aleve, Ibuprofen, Motrin, Naproxen, Naprosyn and Aspirin based products such as Excedrin, Goody's Powder, BC Powder. Stop ANY OVER THE COUNTER supplements until after surgery.  You may however, continue to take Tylenol if needed for pain up until the day of surgery.  **Follow guidelines for insulin and diabetes medications.** Empagliflozin (Jardiance): hold 3 full days prior to surgery , last dose Thursday January 2   **Follow recommendations regarding stopping blood thinners.** Metformin: hold for 2 days prior to surgery , last dose Friday January 3   Continue taking all of your other prescription medications up until the day of surgery.  ON THE DAY OF SURGERY ONLY TAKE THESE MEDICATIONS WITH SIPS OF WATER:  amLODipine (NORVASC)  atorvastatin (LIPITOR)  hydrALAZINE (APRESOLINE)  omeprazole (PRILOSEC)   No Alcohol for 24 hours before or after surgery.  No Smoking including e-cigarettes for 24 hours before surgery.  No chewable tobacco products for at least 6 hours before  surgery.  No nicotine patches on the day of surgery.  Do not use any "recreational" drugs for at least a week (preferably 2 weeks) before your surgery.  Please be advised that the combination of cocaine and anesthesia may have negative outcomes, up to and including death. If you test positive for cocaine, your surgery will be cancelled.  On the morning of surgery brush your teeth with toothpaste and water, you may rinse your mouth with mouthwash if you wish. Do not swallow any toothpaste or mouthwash.  Use CHG Soap or as directed on instruction sheet.  Do not wear jewelry, make-up, hairpins, clips or nail polish.  For welded (permanent) jewelry: bracelets, anklets, waist bands, etc.  Please have this removed prior to surgery.  If it is not removed, there is a chance that hospital personnel will need to cut it off on the day of surgery.  Do not wear lotions, powders, or perfumes.   Do not shave body hair from the neck down 48 hours before surgery.  Contact lenses, hearing aids and dentures may not be worn into surgery.  Do not bring valuables to the hospital. [facility] is not responsible for any missing/lost belongings or valuables.   Notify your doctor if there is any change in your medical condition (cold, fever, infection).  Wear comfortable clothing (specific to your surgery type) to the hospital.  After surgery, you can help prevent lung complications by doing breathing exercises.  Take deep breaths and cough every 1-2 hours.  If you are being discharged the day of  surgery, you will not be allowed to drive home. You will need a responsible individual to drive you home and stay with you for 24 hours after surgery.   If you are taking public transportation, you will need to have a responsible individual with you.  Please call the Pre-admissions Testing Dept. at [phone] if you have any questions about these instructions.  Surgery Visitation Policy:  Patients having  surgery or a procedure may have two visitors.  Children under the age of 39 must have an adult with them who is not the patient.           Preparing for Surgery with CHLORHEXIDINE GLUCONATE (CHG) Soap  Chlorhexidine Gluconate (CHG) Soap  o An antiseptic cleaner that kills germs and bonds with the skin to continue killing germs even after washing  o Used for showering the night before surgery and morning of surgery  Before surgery, you can play an important role by reducing the number of germs on your skin.  CHG (Chlorhexidine gluconate) soap is an antiseptic cleanser which kills germs and bonds with the skin to continue killing germs even after washing.  Please do not use if you have an allergy to CHG or antibacterial soaps. If your skin becomes reddened/irritated stop using the CHG.  1. Shower the NIGHT BEFORE SURGERY and the MORNING OF SURGERY with CHG soap.  2. If you choose to wash your hair, wash your hair first as usual with your normal shampoo.  3. After shampooing, rinse your hair and body thoroughly to remove the shampoo.  4. Use CHG as you would any other liquid soap. You can apply CHG directly to the skin and wash gently with a scrungie or a clean washcloth.  5. Apply the CHG soap to your body only from the neck down. Do not use on open wounds or open sores. Avoid contact with your eyes, ears, mouth, and genitals (private parts). Wash face and genitals (private parts) with your normal soap.  6. Wash thoroughly, paying special attention to the area where your surgery will be performed.  7. Thoroughly rinse your body with warm water.  8. Do not shower/wash with your normal soap after using and rinsing off the CHG soap.  9. Pat yourself dry with a clean towel.  10. Wear clean pajamas to bed the night before surgery.  12. Place clean sheets on your bed the night of your first shower and do not sleep with pets.  13. Shower again with the CHG soap on the day of  surgery prior to arriving at the hospital.  14. Do not apply any deodorants/lotions/powders.  15. Please wear clean clothes to the hospital.

## 2022-12-28 NOTE — Pre-Procedure Instructions (Signed)
Medical Clearance on chart from [facility] PA-Low Risk

## 2023-01-01 ENCOUNTER — Encounter
Admission: RE | Admit: 2023-01-01 | Discharge: 2023-01-01 | Disposition: A | Discharge status: Home / Self Care | Payer: Medicare HMO | Source: Ambulatory Visit | Attending: Neurosurgery | Admitting: Neurosurgery

## 2023-01-01 DIAGNOSIS — Z01812 Encounter for preprocedural laboratory examination: Secondary | ICD-10-CM | POA: Diagnosis present

## 2023-01-01 DIAGNOSIS — Z01818 Encounter for other preprocedural examination: Secondary | ICD-10-CM

## 2023-01-01 LAB — TYPE AND SCREEN
ABO/RH(D): A POS
Antibody Screen: NEGATIVE

## 2023-01-06 MED ORDER — CEFAZOLIN SODIUM-DEXTROSE 2-4 GM/100ML-% IV SOLN
2.0000 g | INTRAVENOUS | Status: AC
  Administered 2023-01-07: 2 g via INTRAVENOUS

## 2023-01-06 MED ORDER — SODIUM CHLORIDE 0.9 % IV SOLN: INTRAVENOUS | Status: DC

## 2023-01-06 MED ORDER — CHLORHEXIDINE GLUCONATE 0.12 % MT SOLN
15.0000 mL | Freq: Once | OROMUCOSAL | Status: AC
  Administered 2023-01-07: 15 mL via OROMUCOSAL

## 2023-01-06 MED ORDER — CEFAZOLIN IN SODIUM CHLORIDE 2-0.9 GM/100ML-% IV SOLN
2.0000 g | Freq: Once | INTRAVENOUS | Status: DC
Start: 2023-01-06 — End: 2023-01-06
  Filled 2023-01-06: qty 100

## 2023-01-06 MED ORDER — ORAL CARE MOUTH RINSE: 15.0000 mL | Freq: Once | OROMUCOSAL | Status: AC

## 2023-01-06 NOTE — Anesthesia Preprocedure Evaluation (Addendum)
 Anesthesia Evaluation  Patient identified by MRN, date of birth, ID band Patient awake    Reviewed: Allergy & Precautions, H&P , NPO status , Patient's Chart, lab work & pertinent test results  Airway Mallampati: II  TM Distance: >3 FB Neck ROM: full    Dental  (+) Edentulous Lower, Edentulous Upper   Pulmonary COPD, Current Smoker and Patient abstained from smoking.   Pulmonary exam normal        Cardiovascular Exercise Tolerance: Good hypertension, Normal cardiovascular exam  EKG Normal 12/28/22   Neuro/Psych  PSYCHIATRIC DISORDERS Anxiety     Cervical radiculopathy  Cervical stenosis of spinal canal  Left arm weakness    Neuromuscular disease    GI/Hepatic Neg liver ROS,GERD  Controlled and Medicated,,  Endo/Other  diabetes, Well Controlled, Type 2    Renal/GU      Musculoskeletal   Abdominal  (+) + obese  Peds  Hematology negative hematology ROS (+)   Anesthesia Other Findings Past Medical History: No date: Anxiety No date: Centrilobular emphysema (HCC) No date: Depression No date: Diabetes mellitus without complication (HCC) No date: GERD (gastroesophageal reflux disease) No date: Hyperlipidemia No date: Hypertension No date: Lipoma of chest wall No date: Palpitations  Past Surgical History: No date: CARDIAC CATHETERIZATION No date: COLONOSCOPY 10/09/2016: MASS EXCISION; Right     Comment:  Procedure: EXCISION MASS RIGHT LATERAL CHEST WALL;                Surgeon: Dellie Louanne MATSU, MD;  Location: [facility] ORS;              Service: General;  Laterality: Right;     Reproductive/Obstetrics negative OB ROS                              Anesthesia Physical Anesthesia Plan  ASA: 2  Anesthesia Plan: General ETT   Post-op Pain Management: Ofirmev  IV (intra-op)* and Toradol  IV (intra-op)*   Induction: Intravenous  PONV Risk Score and Plan: 2 and Ondansetron  and  Dexamethasone   Airway Management Planned: Oral ETT  Additional Equipment:   Intra-op Plan:   Post-operative Plan: Extubation in OR  Informed Consent: I have reviewed the patients History and Physical, chart, labs and discussed the procedure including the risks, benefits and alternatives for the proposed anesthesia with the patient or authorized representative who has indicated his/her understanding and acceptance.     Dental Advisory Given  Plan Discussed with: CRNA and Surgeon  Anesthesia Plan Comments:          Anesthesia Quick Evaluation

## 2023-01-07 ENCOUNTER — Other Ambulatory Visit: Payer: Self-pay

## 2023-01-07 ENCOUNTER — Ambulatory Visit: Payer: Medicare HMO

## 2023-01-07 ENCOUNTER — Encounter: Payer: Self-pay | Admitting: Neurosurgery

## 2023-01-07 ENCOUNTER — Ambulatory Visit: Payer: Medicare HMO | Admitting: Certified Registered Nurse Anesthetist

## 2023-01-07 ENCOUNTER — Ambulatory Visit
Admission: RE | Admit: 2023-01-07 | Discharge: 2023-01-07 | Disposition: A | Discharge status: Home / Self Care | Payer: Medicare HMO | Attending: Neurosurgery | Admitting: Neurosurgery

## 2023-01-07 ENCOUNTER — Encounter
Admission: RE | Disposition: A | Discharge status: Home / Self Care | Payer: Self-pay | Source: Home / Self Care | Attending: Neurosurgery

## 2023-01-07 DIAGNOSIS — I10 Essential (primary) hypertension: Secondary | ICD-10-CM | POA: Diagnosis not present

## 2023-01-07 DIAGNOSIS — E119 Type 2 diabetes mellitus without complications: Secondary | ICD-10-CM | POA: Diagnosis not present

## 2023-01-07 DIAGNOSIS — M4802 Spinal stenosis, cervical region: Secondary | ICD-10-CM | POA: Diagnosis not present

## 2023-01-07 DIAGNOSIS — R29898 Other symptoms and signs involving the musculoskeletal system: Secondary | ICD-10-CM | POA: Insufficient documentation

## 2023-01-07 DIAGNOSIS — Z7984 Long term (current) use of oral hypoglycemic drugs: Secondary | ICD-10-CM | POA: Diagnosis not present

## 2023-01-07 DIAGNOSIS — K219 Gastro-esophageal reflux disease without esophagitis: Secondary | ICD-10-CM | POA: Insufficient documentation

## 2023-01-07 DIAGNOSIS — Z01812 Encounter for preprocedural laboratory examination: Secondary | ICD-10-CM

## 2023-01-07 DIAGNOSIS — J432 Centrilobular emphysema: Secondary | ICD-10-CM | POA: Diagnosis not present

## 2023-01-07 DIAGNOSIS — Z01818 Encounter for other preprocedural examination: Secondary | ICD-10-CM

## 2023-01-07 DIAGNOSIS — F17210 Nicotine dependence, cigarettes, uncomplicated: Secondary | ICD-10-CM | POA: Insufficient documentation

## 2023-01-07 DIAGNOSIS — M5412 Radiculopathy, cervical region: Secondary | ICD-10-CM | POA: Diagnosis not present

## 2023-01-07 HISTORY — PX: ANTERIOR CERVICAL DECOMP/DISCECTOMY FUSION: SHX1161

## 2023-01-07 LAB — ABO/RH: ABO/RH(D): A POS

## 2023-01-07 LAB — GLUCOSE, CAPILLARY
Glucose-Capillary: 136 mg/dL — ABNORMAL HIGH (ref 70–99)
Glucose-Capillary: 150 mg/dL — ABNORMAL HIGH (ref 70–99)

## 2023-01-07 SURGERY — ANTERIOR CERVICAL DECOMPRESSION/DISCECTOMY FUSION 1 LEVEL
Anesthesia: General | Site: Spine Cervical

## 2023-01-07 MED ORDER — FENTANYL CITRATE (PF) 100 MCG/2ML IJ SOLN
INTRAMUSCULAR | Status: AC
  Filled 2023-01-07: qty 2

## 2023-01-07 MED ORDER — DEXAMETHASONE SODIUM PHOSPHATE 10 MG/ML IJ SOLN
INTRAMUSCULAR | Status: DC | PRN
Start: 2023-01-07 — End: 2023-01-07
  Administered 2023-01-07: 10 mg via INTRAVENOUS

## 2023-01-07 MED ORDER — EPHEDRINE 5 MG/ML INJ
INTRAVENOUS | Status: AC
  Filled 2023-01-07: qty 5

## 2023-01-07 MED ORDER — ACETAMINOPHEN 10 MG/ML IV SOLN
INTRAVENOUS | Status: DC | PRN
  Administered 2023-01-07: 1000 mg via INTRAVENOUS

## 2023-01-07 MED ORDER — PROPOFOL 10 MG/ML IV BOLUS
INTRAVENOUS | Status: AC
  Filled 2023-01-07: qty 20

## 2023-01-07 MED ORDER — ACETAMINOPHEN 10 MG/ML IV SOLN: 1000.0000 mg | Freq: Once | INTRAVENOUS | Status: DC | PRN

## 2023-01-07 MED ORDER — BUPIVACAINE-EPINEPHRINE (PF) 0.5% -1:200000 IJ SOLN
INTRAMUSCULAR | Status: DC | PRN
  Administered 2023-01-07: 7 mL

## 2023-01-07 MED ORDER — SUCCINYLCHOLINE CHLORIDE 200 MG/10ML IV SOSY
PREFILLED_SYRINGE | INTRAVENOUS | Status: AC
  Filled 2023-01-07: qty 10

## 2023-01-07 MED ORDER — KETOROLAC TROMETHAMINE 15 MG/ML IJ SOLN
15.0000 mg | Freq: Once | INTRAMUSCULAR | Status: AC
  Administered 2023-01-07: 15 mg via INTRAVENOUS

## 2023-01-07 MED ORDER — MIDAZOLAM HCL 2 MG/2ML IJ SOLN
INTRAMUSCULAR | Status: DC | PRN
  Administered 2023-01-07 (×2): 1 mg via INTRAVENOUS

## 2023-01-07 MED ORDER — ONDANSETRON HCL 4 MG/2ML IJ SOLN
INTRAMUSCULAR | Status: AC
  Filled 2023-01-07: qty 2

## 2023-01-07 MED ORDER — DROPERIDOL 2.5 MG/ML IJ SOLN: 0.6250 mg | Freq: Once | INTRAMUSCULAR | Status: DC | PRN

## 2023-01-07 MED ORDER — HYDROMORPHONE HCL 1 MG/ML IJ SOLN: 0.2500 mg | INTRAMUSCULAR | Status: DC | PRN

## 2023-01-07 MED ORDER — OXYCODONE HCL 5 MG/5ML PO SOLN: 5.0000 mg | Freq: Once | ORAL | Status: AC | PRN

## 2023-01-07 MED ORDER — FENTANYL CITRATE (PF) 100 MCG/2ML IJ SOLN
INTRAMUSCULAR | Status: DC | PRN
  Administered 2023-01-07 (×2): 50 ug via INTRAVENOUS

## 2023-01-07 MED ORDER — OXYCODONE HCL 5 MG PO TABS
ORAL_TABLET | ORAL | Status: AC
  Filled 2023-01-07: qty 1

## 2023-01-07 MED ORDER — LIDOCAINE HCL (PF) 2 % IJ SOLN
INTRAMUSCULAR | Status: AC
  Filled 2023-01-07: qty 5

## 2023-01-07 MED ORDER — ACETAMINOPHEN 10 MG/ML IV SOLN
INTRAVENOUS | Status: AC
  Filled 2023-01-07: qty 100

## 2023-01-07 MED ORDER — OXYCODONE HCL 5 MG PO TABS: 5.0000 mg | ORAL_TABLET | ORAL | 0 refills | Status: DC | PRN

## 2023-01-07 MED ORDER — 0.9 % SODIUM CHLORIDE (POUR BTL) OPTIME
TOPICAL | Status: DC | PRN
  Administered 2023-01-07: 500 mL

## 2023-01-07 MED ORDER — LIDOCAINE HCL (CARDIAC) PF 100 MG/5ML IV SOSY
PREFILLED_SYRINGE | INTRAVENOUS | Status: DC | PRN
  Administered 2023-01-07: 100 mg via INTRAVENOUS

## 2023-01-07 MED ORDER — METHOCARBAMOL 500 MG PO TABS
500.0000 mg | ORAL_TABLET | Freq: Four times a day (QID) | ORAL | Status: DC | PRN
  Administered 2023-01-07: 500 mg via ORAL

## 2023-01-07 MED ORDER — KETOROLAC TROMETHAMINE 15 MG/ML IJ SOLN
INTRAMUSCULAR | Status: AC
  Filled 2023-01-07: qty 1

## 2023-01-07 MED ORDER — SUCCINYLCHOLINE CHLORIDE 200 MG/10ML IV SOSY
PREFILLED_SYRINGE | INTRAVENOUS | Status: DC | PRN
  Administered 2023-01-07: 100 mg via INTRAVENOUS

## 2023-01-07 MED ORDER — PROPOFOL 500 MG/50ML IV EMUL
INTRAVENOUS | Status: DC | PRN
  Administered 2023-01-07: 100 ug/kg/min via INTRAVENOUS

## 2023-01-07 MED ORDER — DEXAMETHASONE SODIUM PHOSPHATE 10 MG/ML IJ SOLN
INTRAMUSCULAR | Status: AC
  Filled 2023-01-07: qty 1

## 2023-01-07 MED ORDER — GLYCOPYRROLATE 0.2 MG/ML IJ SOLN
INTRAMUSCULAR | Status: DC | PRN
Start: 2023-01-07 — End: 2023-01-07
  Administered 2023-01-07: .2 mg via INTRAVENOUS

## 2023-01-07 MED ORDER — SURGIFLO WITH THROMBIN (HEMOSTATIC MATRIX KIT) OPTIME
TOPICAL | Status: DC | PRN
  Administered 2023-01-07: 1 via TOPICAL

## 2023-01-07 MED ORDER — GLYCOPYRROLATE 0.2 MG/ML IJ SOLN
INTRAMUSCULAR | Status: AC
  Filled 2023-01-07: qty 1

## 2023-01-07 MED ORDER — MIDAZOLAM HCL 2 MG/2ML IJ SOLN
INTRAMUSCULAR | Status: AC
Start: 2023-01-07 — End: ?
  Filled 2023-01-07: qty 2

## 2023-01-07 MED ORDER — CELECOXIB 100 MG PO CAPS: 100.0000 mg | ORAL_CAPSULE | Freq: Two times a day (BID) | ORAL | 0 refills | Status: DC

## 2023-01-07 MED ORDER — CHLORHEXIDINE GLUCONATE 0.12 % MT SOLN
OROMUCOSAL | Status: AC
  Filled 2023-01-07: qty 15

## 2023-01-07 MED ORDER — PHENYLEPHRINE HCL-NACL 20-0.9 MG/250ML-% IV SOLN
INTRAVENOUS | Status: AC
  Filled 2023-01-07: qty 250

## 2023-01-07 MED ORDER — PHENYLEPHRINE HCL-NACL 20-0.9 MG/250ML-% IV SOLN
INTRAVENOUS | Status: DC | PRN
  Administered 2023-01-07: 25 ug/min via INTRAVENOUS

## 2023-01-07 MED ORDER — ONDANSETRON HCL 4 MG/2ML IJ SOLN
INTRAMUSCULAR | Status: DC | PRN
  Administered 2023-01-07: 4 mg via INTRAVENOUS

## 2023-01-07 MED ORDER — METHOCARBAMOL 500 MG PO TABS: 500.0000 mg | ORAL_TABLET | Freq: Four times a day (QID) | ORAL | 0 refills | Status: DC | PRN

## 2023-01-07 MED ORDER — BUPIVACAINE-EPINEPHRINE (PF) 0.5% -1:200000 IJ SOLN
INTRAMUSCULAR | Status: AC
  Filled 2023-01-07: qty 10

## 2023-01-07 MED ORDER — PROPOFOL 10 MG/ML IV BOLUS
INTRAVENOUS | Status: DC | PRN
  Administered 2023-01-07: 20 mg via INTRAVENOUS
  Administered 2023-01-07: 130 mg via INTRAVENOUS

## 2023-01-07 MED ORDER — PROPOFOL 1000 MG/100ML IV EMUL
INTRAVENOUS | Status: AC
  Filled 2023-01-07: qty 100

## 2023-01-07 MED ORDER — METHOCARBAMOL 500 MG PO TABS
ORAL_TABLET | ORAL | Status: AC
  Filled 2023-01-07: qty 1

## 2023-01-07 MED ORDER — FENTANYL CITRATE (PF) 100 MCG/2ML IJ SOLN
25.0000 ug | INTRAMUSCULAR | Status: DC | PRN
  Administered 2023-01-07: 25 ug via INTRAVENOUS
  Administered 2023-01-07: 50 ug via INTRAVENOUS
  Administered 2023-01-07 (×2): 25 ug via INTRAVENOUS

## 2023-01-07 MED ORDER — EPHEDRINE SULFATE-NACL 50-0.9 MG/10ML-% IV SOSY
PREFILLED_SYRINGE | INTRAVENOUS | Status: DC | PRN
  Administered 2023-01-07 (×2): 5 mg via INTRAVENOUS

## 2023-01-07 MED ORDER — OXYCODONE HCL 5 MG PO TABS
5.0000 mg | ORAL_TABLET | Freq: Once | ORAL | Status: AC | PRN
  Administered 2023-01-07: 5 mg via ORAL

## 2023-01-07 MED ORDER — SODIUM CHLORIDE 0.9 % IV SOLN
INTRAVENOUS | Status: DC | PRN
  Administered 2023-01-07: .04 ug/kg/min via INTRAVENOUS

## 2023-01-07 MED ORDER — REMIFENTANIL HCL 1 MG IV SOLR
INTRAVENOUS | Status: AC
  Filled 2023-01-07: qty 1000

## 2023-01-07 MED ORDER — CEFAZOLIN SODIUM-DEXTROSE 2-4 GM/100ML-% IV SOLN
INTRAVENOUS | Status: AC
  Filled 2023-01-07: qty 100

## 2023-01-07 SURGICAL SUPPLY — 50 items
BASIN KIT SINGLE STR (MISCELLANEOUS) ×1 IMPLANT
BUR NEURO DRILL SOFT 3.0X3.8M (BURR) ×1 IMPLANT
DERMABOND ADVANCED .7 DNX12 (GAUZE/BANDAGES/DRESSINGS) ×1 IMPLANT
DRAIN CHANNEL JP 10F RND 20C F (MISCELLANEOUS) IMPLANT
DRAPE C ARM PK CFD 31 SPINE (DRAPES) ×1 IMPLANT
DRAPE LAPAROTOMY 77X122 PED (DRAPES) ×1 IMPLANT
DRAPE MICROSCOPE SPINE 48X150 (DRAPES) ×1 IMPLANT
DRSG TEGADERM 4X4.75 (GAUZE/BANDAGES/DRESSINGS) IMPLANT
ELECT REM PT RETURN 9FT ADLT (ELECTROSURGICAL) ×1
ELECTRODE REM PT RTRN 9FT ADLT (ELECTROSURGICAL) ×1 IMPLANT
EVACUATOR 1/8 PVC DRAIN (DRAIN) IMPLANT
EVACUATOR SILICONE 100CC (DRAIN) IMPLANT
FEE INTRAOP CADWELL SUPPLY NCS (MISCELLANEOUS) IMPLANT
FEE INTRAOP MONITOR IMPULS NCS (MISCELLANEOUS) IMPLANT
GAUZE SPONGE 2X2 STRL 8-PLY (GAUZE/BANDAGES/DRESSINGS) IMPLANT
GLOVE BIOGEL PI IND STRL 6.5 (GLOVE) ×1 IMPLANT
GLOVE SURG SYN 6.5 ES PF (GLOVE) ×1 IMPLANT
GLOVE SURG SYN 6.5 PF PI (GLOVE) ×1 IMPLANT
GLOVE SURG SYN 8.5 E (GLOVE) ×3 IMPLANT
GLOVE SURG SYN 8.5 PF PI (GLOVE) ×3 IMPLANT
GOWN SRG LRG LVL 4 IMPRV REINF (GOWNS) ×1 IMPLANT
GOWN SRG XL LVL 3 NONREINFORCE (GOWNS) ×1 IMPLANT
HOLDER FOLEY CATH W/STRAP (MISCELLANEOUS) IMPLANT
INTRAOP CADWELL SUPPLY FEE NCS (MISCELLANEOUS)
INTRAOP MONITOR FEE IMPULS NCS (MISCELLANEOUS)
KIT TURNOVER KIT A (KITS) ×1 IMPLANT
MANIFOLD NEPTUNE II (INSTRUMENTS) ×1 IMPLANT
NDL SAFETY ECLIPSE 18X1.5 (NEEDLE) ×1 IMPLANT
NS IRRIG 500ML POUR BTL (IV SOLUTION) ×1 IMPLANT
PACK LAMINECTOMY ARMC (PACKS) ×1 IMPLANT
PAD ARMBOARD 7.5X6 YLW CONV (MISCELLANEOUS) ×2 IMPLANT
PIN CASPAR 14 (PIN) ×1 IMPLANT
PIN CASPAR 14MM (PIN) ×1
PLATE ACP 1.6 18 (Plate) IMPLANT
SCREW ACP VA SD 3.5X15 (Screw) IMPLANT
SPACER CERVICAL FRGE 12X14X8-7 (Spacer) IMPLANT
SPONGE KITTNER 5P (MISCELLANEOUS) ×1 IMPLANT
STAPLER SKIN PROX 35W (STAPLE) IMPLANT
SURGIFLO W/THROMBIN 8M KIT (HEMOSTASIS) ×1 IMPLANT
SUT ETHILON 3 0 PS 1 (SUTURE) IMPLANT
SUT ETHILON 3-0 FS-10 30 BLK (SUTURE)
SUT STRATA 3-0 15 PS-2 (SUTURE) ×1 IMPLANT
SUT VIC AB 3-0 SH 8-18 (SUTURE) IMPLANT
SUT VICRYL 3-0 CR8 SH (SUTURE) ×1 IMPLANT
SUTURE EHLN 3-0 FS-10 30 BLK (SUTURE) IMPLANT
SYR 20ML LL LF (SYRINGE) ×1 IMPLANT
TAPE CLOTH 3X10 WHT NS LF (GAUZE/BANDAGES/DRESSINGS) ×2 IMPLANT
TRAP FLUID SMOKE EVACUATOR (MISCELLANEOUS) ×1 IMPLANT
TRAY FOLEY SLVR 16FR LF STAT (SET/KITS/TRAYS/PACK) IMPLANT
WATER STERILE IRR 500ML POUR (IV SOLUTION) IMPLANT

## 2023-01-07 NOTE — Discharge Summary (Signed)
 Discharge Summary  Patient ID: SOKHA CRAKER MRN: [id number] DOB/AGE: January 19, 1950 73 y.o.  Admit date: 01/07/2023 Discharge date: 01/07/2023  Admission Diagnoses:  M54.12 Cervical radiculopathy , M48.02 Cervical stenosis of spinal canal , R29.898 Left arm weakness.    Discharge Diagnoses:  Active Problems:   Cervical radiculopathy   Spinal stenosis in cervical region   Left arm weakness   Discharged Condition: good  Hospital Course:  Tami Sanders is a pleasant 73 y.o presenting with cervical radiculopathy and left arm weakness s/p C6-7 ACDF. Her intraoperative course was uncomplicated. She was monitored in PACU for a minimum of 4 hours and discharged home after ambulating, urinating, and tolerating PO intake. She was given prescriptions for Oxycodone , Robaxin , and Celebrex  to take as needed.  Consults: None  Significant Diagnostic Studies: none  Treatments: surgery: as above. Please see separately dictated operative report   Discharge Exam: Blood pressure (!) 177/90, pulse 72, temperature (!) 97.1 F (36.2 C), temperature source Temporal, resp. rate 18, height 5' 4 (1.626 m), weight 88.5 kg, SpO2 100%. A&O MAEW Incision c/d/I with dermabond in place   Disposition: Discharge disposition: 01-Home or Self Care        Allergies as of 01/07/2023       Reactions   Ace Inhibitors Cough        Medication List     STOP taking these medications    ibuprofen 200 MG tablet Commonly known as: ADVIL       TAKE these medications    amLODipine 10 MG tablet Commonly known as: NORVASC Take 10 mg by mouth every morning.   atorvastatin 20 MG tablet Commonly known as: LIPITOR Take 20 mg by mouth daily.   celecoxib  100 MG capsule Commonly known as: CeleBREX  Take 1 capsule (100 mg total) by mouth 2 (two) times daily.   hydrALAZINE  25 MG tablet Commonly known as: APRESOLINE  Take 25 mg by mouth in the morning.   Jardiance 10 MG Tabs tablet Generic drug:  empagliflozin Take 10 mg by mouth daily.   metFORMIN 750 MG 24 hr tablet Commonly known as: GLUCOPHAGE-XR Take 750 mg by mouth in the morning. Take 1 tablet (750 mg) by mouth twice daily   methocarbamol  500 MG tablet Commonly known as: ROBAXIN  Take 1 tablet (500 mg total) by mouth every 6 (six) hours as needed for muscle spasms.   omeprazole 20 MG capsule Commonly known as: PRILOSEC Take 20 mg by mouth in the morning.   oxyCODONE  5 MG immediate release tablet Commonly known as: Oxy IR/ROXICODONE  Take 1 tablet (5 mg total) by mouth every 4 (four) hours as needed for severe pain (pain score 7-10).   potassium chloride  10 MEQ tablet Commonly known as: KLOR-CON  Take 1 tablet (10 mEq total) by mouth daily.   spironolactone 25 MG tablet Commonly known as: ALDACTONE Take 25 mg by mouth in the morning.        Follow-up Information     Gregory Edsel Ruth, PA Follow up on 01/22/2023.   Specialty: Neurosurgery Contact information: [address] [place] [address] [phone]                 Signed: Edsel Ruth Gregory 01/07/2023, 8:44 AM

## 2023-01-07 NOTE — Progress Notes (Addendum)
 error

## 2023-01-07 NOTE — Transfer of Care (Signed)
 Immediate Anesthesia Transfer of Care Note  Patient: Tami Sanders  Procedure(s) Performed: C6-7 ANTERIOR CERVICAL DISCECTOMY AND FUSION (FORGE) (Spine Cervical)  Patient Location: PACU  Anesthesia Type:General  Level of Consciousness: awake, alert , and oriented  Airway & Oxygen Therapy: Patient Spontanous Breathing and Patient connected to face mask oxygen  Post-op Assessment: Report given to RN and Post -op Vital signs reviewed and stable  Post vital signs: Reviewed and stable  Last Vitals:  Vitals Value Taken Time  BP 170/89 01/07/23 0855  Temp 36.6 C 01/07/23 0855  Pulse 83 01/07/23 0857  Resp 15 01/07/23 0858  SpO2 100 % 01/07/23 0857  Vitals shown include unfiled device data.  Last Pain:  Vitals:   01/07/23 0614  TempSrc: Temporal  PainSc: 6          Complications: No notable events documented.

## 2023-01-07 NOTE — Progress Notes (Signed)
 Patient ambulated to the bathroom x2.tolerated food and has urinated twice. Plan to discharge at 1250

## 2023-01-07 NOTE — Anesthesia Postprocedure Evaluation (Signed)
 Anesthesia Post Note  Patient: SHANAYAH KAFFENBERGER  Procedure(s) Performed: C6-7 ANTERIOR CERVICAL DISCECTOMY AND FUSION (FORGE) (Spine Cervical)  Patient location during evaluation: PACU Anesthesia Type: General Level of consciousness: awake and alert Pain management: pain level controlled Vital Signs Assessment: post-procedure vital signs reviewed and stable Respiratory status: spontaneous breathing, nonlabored ventilation and respiratory function stable Cardiovascular status: blood pressure returned to baseline and stable Postop Assessment: no apparent nausea or vomiting Anesthetic complications: no   No notable events documented.   Last Vitals:  Vitals:   01/07/23 1059 01/07/23 1249  BP: (!) 155/69 (!) 162/85  Pulse: 65 65  Resp: 18 16  Temp: 36.7 C 36.7 C  SpO2: 96% 95%    Last Pain:  Vitals:   01/07/23 1249  TempSrc: Oral  PainSc: 0-No pain                 Camellia Merilee Louder

## 2023-01-07 NOTE — Discharge Instructions (Signed)
 Your surgeon has performed an operation on your cervical spine (neck) to relieve pressure on the spinal cord and/or nerves. This involved making an incision in the front of your neck and removing one or more of the discs that support your spine. Next, a small piece of bone, a titanium plate, and screws were used to fuse two or more of the vertebrae (bones) together.  The following are instructions to help in your recovery once you have been discharged from the hospital. Even if you feel well, it is important that you follow these activity guidelines. If you do not let your neck heal properly from the surgery, you can increase the chance of return of your symptoms and other complications.  * Do not take anti-inflammatory medications for 3 months after surgery (naproxen [Aleve], ibuprofen [Advil, Motrin], etc.). These medications can prevent your bones from healing properly.  Celebrex, if prescribed, is ok to take.  Activity    No bending, lifting, or twisting ("BLT"). Avoid lifting objects heavier than 10 pounds (gallon milk jug).  Where possible, avoid household activities that involve lifting, bending, reaching, pushing, or pulling such as laundry, vacuuming, grocery shopping, and childcare. Try to arrange for help from friends and family for these activities while your back heals.  Increase physical activity slowly as tolerated.  Taking short walks is encouraged, but avoid strenuous exercise. Do not jog, run, bicycle, lift weights, or participate in any other exercises unless specifically allowed by your doctor.  Talk to your doctor before resuming sexual activity.  You should not drive until cleared by your doctor.  Until released by your doctor, you should not return to work or school.  You should rest at home and let your body heal.   You may shower three days after your surgery.  After showering, lightly dab your incision dry. Do not take a tub bath or go swimming until approved by your  doctor at your follow-up appointment.  If your doctor ordered a cervical collar (neck brace) for you, you should wear it whenever you are out of bed. You may remove it when lying down or sleeping, but you should wear it at all other times. Not all neck surgeries require a cervical collar.  If you smoke, we strongly recommend that you quit.  Smoking has been proven to interfere with normal bone healing and will dramatically reduce the success rate of your surgery. Please contact [facility] (800-QUIT-NOW) and use the resources at www.[facility].com for assistance in stopping smoking.  Surgical Incision   If you have a dressing on your incision, you may remove it two days after your surgery. Keep your incision area clean and dry.  If you have staples or stitches on your incision, you should have a follow up scheduled for removal. If you do not have staples or stitches, you will have steri-strips (small pieces of surgical tape) or Dermabond glue. The steri-strips/glue should begin to peel away within about a week (it is fine if the steri-strips fall off before then). If the strips are still in place one week after your surgery, you may gently remove them.  Diet           You may return to your usual diet. However, you may experience discomfort when swallowing in the first month after your surgery. This is normal. You may find that softer foods are more comfortable for you to swallow. Be sure to stay hydrated.  When to Contact us  You may experience pain in your  neck and/or pain between your shoulder blades. This is normal and should improve in the next few weeks with the help of pain medication, muscle relaxers, and rest. Some patients report that a warm compress on the back of the neck or between the shoulder blades helps.  However, should you experience any of the following, contact us immediately: New numbness or weakness Pain that is progressively getting worse, and is not relieved by your pain  medication, muscle relaxers, rest, and warm compresses Bleeding, redness, swelling, pain, or drainage from surgical incision Chills or flu-like symptoms Fever greater than 101.0 F (38.3 C) Inability to eat, drink fluids, or take medications Problems with bowel or bladder functions Difficulty breathing or shortness of breath Warmth, tenderness, or swelling in your calf Contact Information How to contact us:  If you have any questions/concerns before or after surgery, you can reach [place] at [phone], or you can send a mychart message. We can be reached by phone or mychart 8am-4pm, Monday-Friday.  *Please note: Calls after 4pm are forwarded to a third party answering service. Mychart messages are not routinely monitored during evenings, weekends, and holidays. Please call our office to contact the answering service for urgent concerns during non-business hours.

## 2023-01-07 NOTE — Op Note (Signed)
 Indications: [name] is a 73 y.o. 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 Due to ongoing weakness, surgery was recommended.  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   Preoperative Note:    Risks of surgery discussed include: infection, bleeding, stroke, coma, death, paralysis, CSF leak, nerve/spinal cord injury, numbness, tingling, weakness, complex regional pain syndrome, recurrent stenosis and/or disc herniation, vascular injury, development of instability, neck/back pain, need for further surgery, persistent symptoms, development of deformity, and the risks of anesthesia. The patient understood these risks and agreed to proceed.  Procedure:  1) Anterior cervical diskectomy and fusion at C6-7 2) Anterior cervical instrumentation at C6-7 3) Structural allograft consisting of corticocancellous allograft   Procedure: After obtaining informed consent, the patient taken to the operating room, placed in supine position, general anesthesia induced.  The patient had a small shoulder roll placed behind their shoulders.  The patient received preop antibiotics and IV Decadron .  The patient had a neck incision outlined, was prepped and draped in usual sterile fashion. The incision was injected with local anesthetic.   An incision was opened, dissection taken down medial to the carotid artery and jugular vein, lateral to the trachea and esophagus.  The prevertebral fascia was identified and a localizing x-ray demonstrated the correct level.  The longus colli were dissected laterally, and self-retaining retractors  placed to open the operative field. The microscope was then brought into the field.  With this complete, distractor pins were placed in the vertebral bodies of C6 and C7. The distractor was placed, and the annulus at C6/7 was opened using a bovie.  Curettes and pituitary rongeurs used to remove the majority of disk, then the drill was used to remove the posterior osteophyte, expose the posterior longitudinal ligament, and begin the foraminotomies. The nerve hook was used to elevate the posterior longitudinal ligament, which was then removed with Kerrison rongeurs to complete decompression of the spinal cord. The Kerrison rongeurs were then used to complete the foraminotomies bilaterally to decompress the nerve roots. The nerve hook could be passed out each foramen, ensuring decompression of the nerve roots.  Meticulous hemostasis obtained.  Structural allograft was tapped behind the anterior lip of the vertebral body at C6/7 (8 mm).    Please note that the procedure included removal of the disc, removal of the posterior osteophytes, and removal of the posterior longitudinal ligament to ensure decompression of the spinal cord.  Additionally, foraminotomies were performed on both sides of the spinal canal to decompress the nerve roots.  The caspar distractor was removed, and bone wax used for hemostasis. A separate, 18 mm Nuvasive ACP plate was chosen.  Two screws placed in each vertebral body, respectively making sure the screws were behind the locking mechanism.  Final AP and lateral radiographs were taken.   Please note that the plate is not inclusive to the interbody structural allograft.  The anchoring mechanism of the plate is completely separate from the allograft.  With everything in good position, the wound was irrigated copiously and meticulous hemostasis obtained.  Wound was closed in 2 layers using interrupted inverted 3-0 Vicryl sutures.  The wound was dressed with dermabond, the  head of bed at  30 degrees, taken to recovery room in stable condition.  No new postop neurological deficits were identified.  Sponge and pattie counts were correct at the end of the procedure.    I performed the entire procedure with Edsel Goods PA as an designer, television/film set. An assistant was required for this procedure due to the complexity.  The assistant provided assistance in tissue manipulation and suction, and was required for the successful and safe performance of the procedure. I performed the critical portions of the procedure.   Reeves Daisy MD

## 2023-01-07 NOTE — Anesthesia Procedure Notes (Signed)
 Procedure Name: Intubation Date/Time: 01/07/2023 7:22 AM  Performed by: Duwayne Craven, CRNAPre-anesthesia Checklist: Patient identified, Patient being monitored, Timeout performed, Emergency Drugs available and Suction available Patient Re-evaluated:Patient Re-evaluated prior to induction Oxygen Delivery Method: Circle system utilized Preoxygenation: Pre-oxygenation with 100% oxygen Induction Type: IV induction Ventilation: Mask ventilation without difficulty Laryngoscope Size: 3 and McGrath Grade View: Grade I Tube type: Oral Tube size: 6.5 mm Number of attempts: 1 Airway Equipment and Method: Stylet and Video-laryngoscopy Placement Confirmation: ETT inserted through vocal cords under direct vision, positive ETCO2 and breath sounds checked- equal and bilateral Secured at: 21 cm Tube secured with: Tape Dental Injury: Teeth and Oropharynx as per pre-operative assessment

## 2023-01-07 NOTE — H&P (Signed)
 Referring Physician:  No referring provider defined for this encounter.  Primary Physician:  [facility]  History of Present Illness: 01/07/2023 Tami Sanders presents today with continued left arm pain.    11/27/2022 Tami Sanders is here today with a chief complaint of left arm pain.  She also has neck pain.  This has been ongoing for 5 months.  Her pain has been worsened over that time.  Laying down makes it worse.  Turning her head to the left and driving makes it worse.  Nothing really helps.  She has had injections, which only helped for short period.  She also tried physical therapy for several visits, but it worsened her pain.  She is having some weakness in her left arm. Bowel/Bladder Dysfunction: none  Conservative measures:  Physical therapy:  has participated in at [facility] from 09/06/22 to 09/20/22 Multimodal medical therapy including regular antiinflammatories:  prednisone , gabapentin, ibuprofen  Injections: C6-7 TFESI on 10/11/2022  C5-6 TFESI 11/01/2022   Past Surgery: no  Tami Sanders has no symptoms of cervical myelopathy.  The symptoms are causing a significant impact on the patient's life.   I have utilized the care everywhere function in epic to review the outside records available from external health systems.  Review of Systems:  A 10 point review of systems is negative, except for the pertinent positives and negatives detailed in the HPI.  Past Medical History: Past Medical History:  Diagnosis Date   Anxiety    Centrilobular emphysema (HCC)    Depression    Diabetes mellitus without complication (HCC)    GERD (gastroesophageal reflux disease)    Hyperlipidemia    Hypertension    Lipoma of chest wall    Palpitations     Past Surgical History: Past Surgical History:  Procedure Laterality Date   CARDIAC CATHETERIZATION     COLONOSCOPY     MASS EXCISION Right 10/09/2016   Procedure: EXCISION MASS RIGHT LATERAL CHEST  WALL;  Surgeon: Dellie Louanne MATSU, Tami Sanders;  Location: [facility] ORS;  Service: General;  Laterality: Right;    Allergies: Allergies as of 11/27/2022 - Review Complete 11/27/2022  Allergen Reaction Noted   Ace inhibitors Cough 05/05/2019    Medications:  Current Facility-Administered Medications:    0.9 %  sodium chloride  infusion, , Intravenous, Continuous, Tami Fess, Tami Sanders   ceFAZolin  (ANCEF ) IVPB 2g/100 mL premix, 2 g, Intravenous, 60 min Pre-Op, Tami Fret, Tami Sanders  Social History: Social History   Tobacco Use   Smoking status: Every Day    Current packs/day: 0.25    Average packs/day: 0.3 packs/day for 42.0 years (10.5 ttl pk-yrs)    Types: Cigarettes   Smokeless tobacco: Never   Tobacco comments:    4-5 cigarettes a day  Vaping Use   Vaping status: Never Used  Substance Use Topics   Alcohol use: No   Drug use: No    Family Medical History: Family History  Problem Relation Age of Onset   Cancer Mother        breast   Breast cancer Mother    Cancer Father        prostate   Cancer Paternal Aunt         breast   Breast cancer Paternal Aunt     Physical Examination: Vitals:   01/07/23 0614  BP: (!) 177/90  Pulse: 72  Resp: 18  Temp: (!) 97.1 F (36.2 C)  SpO2: 100%   Heart sounds normal no  MRG. Chest Clear to Auscultation Bilaterally.  General: Patient is in no apparent distress. Attention to examination is appropriate.  Neck:   Supple.  Full range of motion.  Respiratory: Patient is breathing without any difficulty.   NEUROLOGICAL:     Awake, alert, oriented to person, place, and time.  Speech is clear and fluent.   Cranial Nerves: Pupils equal round and reactive to light.  Facial tone is symmetric.  Facial sensation is symmetric. Shoulder shrug is symmetric. Tongue protrusion is midline.  There is no pronator drift.  Strength: Side Biceps Triceps Deltoid Interossei Grip Wrist Ext. Wrist Flex.  R 5 5 5 5 5 5 5   L 5 4- 5 5 5 5 5    Side  Iliopsoas Quads Hamstring PF DF EHL  R 5 5 5 5 5 5   L 5 5 5 5 5 5    Reflexes are 1+ and symmetric at the biceps, triceps, brachioradialis, patella and achilles.   Hoffman's is absent.   Bilateral upper and lower extremity sensation is intact to light touch.    No evidence of dysmetria noted.  Gait is normal.     Medical Decision Making  Imaging: MR C spine 08/22/2022 Disc levels:   C2-C3: There is a shallow protrusion and mild uncovertebral and facet arthropathy resulting in mild spinal canal stenosis without significant neural foraminal stenosis   C3-C4: There is a small central protrusion and mild uncovertebral and facet arthropathy resulting in mild spinal canal stenosis without significant neural foraminal stenosis   C4-C5: Minimal uncovertebral and facet arthropathy without significant spinal canal or neural foraminal stenosis   C5-C6: Minimal uncovertebral and facet arthropathy without significant spinal canal or neural foraminal stenosis   C6-C7: There is a disc bulge and mild uncovertebral and facet arthropathy resulting in mild-to-moderate spinal canal stenosis and severe left and mild right neural foraminal stenosis   C7-T1: No significant spinal canal or neural foraminal stenosis.   IMPRESSION: 1. Disc bulge and mild uncovertebral and facet arthropathy at C6-C7 resulting in mild-to-moderate spinal canal stenosis and severe left and mild right neural foraminal stenosis. 2. Otherwise overall mild degenerative changes throughout the remainder of the cervical spine without other high-grade spinal canal or neural foraminal stenosis.     Electronically Signed   By: Tami Harry M.D.   On: 08/27/2022 15:40  I have personally reviewed the images and agree with the above interpretation.  Assessment and Plan: [name] is a pleasant 73 y.o. 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  She has weakness in her left triceps.  She has  objective weakness, and has not had improvement with conservative management to this point.  Given her objective weakness, there is no role for further conservative management.  We will proceed with  C6-7 anterior cervical discectomy and fusion.      Tami Sanders, [facility]

## 2023-01-08 ENCOUNTER — Encounter: Payer: Self-pay | Admitting: Neurosurgery

## 2023-01-22 ENCOUNTER — Encounter: Payer: Self-pay | Admitting: Neurosurgery

## 2023-01-22 ENCOUNTER — Ambulatory Visit (INDEPENDENT_AMBULATORY_CARE_PROVIDER_SITE_OTHER): Payer: Medicare HMO | Admitting: Neurosurgery

## 2023-01-22 VITALS — BP 128/80 | Temp 98.1°F | Ht 64.0 in | Wt 195.0 lb

## 2023-01-22 DIAGNOSIS — R29898 Other symptoms and signs involving the musculoskeletal system: Secondary | ICD-10-CM

## 2023-01-22 DIAGNOSIS — M5412 Radiculopathy, cervical region: Secondary | ICD-10-CM

## 2023-01-22 DIAGNOSIS — Z981 Arthrodesis status: Secondary | ICD-10-CM

## 2023-01-22 DIAGNOSIS — M4802 Spinal stenosis, cervical region: Secondary | ICD-10-CM

## 2023-01-22 NOTE — Progress Notes (Signed)
   REFERRING PHYSICIAN:  Inc, [facility] [address] [place],  [place] [address]  DOS: 01/07/23 C6-7 ACDF  HISTORY OF PRESENT ILLNESS: Tami Sanders is about 2 weeks status post ACDF. Overall, she is doing postoperatively.  She did have some sore throat after surgery but this has resolved.  She is tolerating a largely normal diet.  She may has had resolution of her preop left arm pain.  PHYSICAL EXAMINATION:  NEUROLOGICAL:  General: In no acute distress.   Awake, alert, oriented to person, place, and time.  Pupils equal round and reactive to light.  Facial tone is symmetric.  Tongue protrusion is midline.  There is no pronator drift.  Strength: Side Biceps Triceps Deltoid Interossei Grip Wrist Ext. Wrist Flex.  R 5 5 5 5 5 5 5   L 5 4- 5 5 5 5 5    Incision c/d/I and healing well  Imaging:  No interval imaging to review  Assessment / Plan: Tami Sanders is doing well after recent ACDF for left arm pain and weakness.  She does continue to have left tricep weakness.  We discussed physical therapy versus escalating her activity on her own and she would prefer to attempt activity escalation independent of therapy.  She denies the need for any medication refills at this time.  We discussed activity escalation and I have advised the patient to lift up to 10 pounds until 6 weeks after surgery, then increase up to 25 pounds until 12 weeks after surgery.  After 12 weeks post-op, the patient advised to increase activity as tolerated. she will return to clinic in approximately 4 weeks to see Dr. Myer Haff with cervical x-rays prior or sooner should she have any questions or concerns.  Expressed understanding and was in agreement with this plan.  Manning Charity PA-C Dept of Neurosurgery

## 2023-02-19 ENCOUNTER — Encounter: Payer: Self-pay | Admitting: Neurosurgery

## 2023-02-19 ENCOUNTER — Other Ambulatory Visit: Payer: Self-pay

## 2023-02-19 ENCOUNTER — Ambulatory Visit (INDEPENDENT_AMBULATORY_CARE_PROVIDER_SITE_OTHER): Payer: Medicare HMO | Admitting: Neurosurgery

## 2023-02-19 VITALS — BP 160/90 | Temp 97.8°F | Ht 64.0 in | Wt 195.0 lb

## 2023-02-19 DIAGNOSIS — Z981 Arthrodesis status: Secondary | ICD-10-CM

## 2023-02-19 DIAGNOSIS — M4802 Spinal stenosis, cervical region: Secondary | ICD-10-CM

## 2023-02-19 DIAGNOSIS — R29898 Other symptoms and signs involving the musculoskeletal system: Secondary | ICD-10-CM

## 2023-02-19 DIAGNOSIS — M5412 Radiculopathy, cervical region: Secondary | ICD-10-CM

## 2023-02-19 NOTE — Progress Notes (Signed)
   REFERRING PHYSICIAN:  Inc, [facility] [address] [place],  [place] [address]  DOS: 01/07/23 C6-7 ACDF  HISTORY OF PRESENT ILLNESS: JETAUN COLBATH is status post ACDF. Overall, she is doing  well.  I am very pleased with her improvements.  Her symptoms are much improved.  She still has some mild neck pain and some numbness around her left chin.  PHYSICAL EXAMINATION:  NEUROLOGICAL:  General: In no acute distress.   Awake, alert, oriented to person, place, and time.  Pupils equal round and reactive to light.  Facial tone is symmetric.  Tongue protrusion is midline.  There is no pronator drift.  Strength: Side Biceps Triceps Deltoid Interossei Grip Wrist Ext. Wrist Flex.  R 5 5 5 5 5 5 5   L 5 4+ 5 5 5 5 5    Incision c/d/I and healing well  Imaging:  No interval imaging to review  Assessment / Plan: VERMELL MADRID is doing well after recent ACDF for left arm pain and weakness.  Her strength has been improving.  She is very happy with her response to surgery.  We reviewed her activity limitations.  We will see her back in clinic in 6 weeks.  I have given her exercises for her neck.    Venetia Night MD Dept of Neurosurgery

## 2023-02-28 ENCOUNTER — Ambulatory Visit
Admission: RE | Admit: 2023-02-28 | Discharge: 2023-02-28 | Disposition: A | Discharge status: Home / Self Care | Payer: Medicare HMO | Attending: Neurosurgery | Admitting: Neurosurgery

## 2023-02-28 ENCOUNTER — Ambulatory Visit
Admission: RE | Admit: 2023-02-28 | Discharge: 2023-02-28 | Disposition: A | Discharge status: Home / Self Care | Payer: Medicare HMO | Source: Ambulatory Visit | Attending: Neurosurgery | Admitting: Neurosurgery

## 2023-02-28 DIAGNOSIS — M5412 Radiculopathy, cervical region: Secondary | ICD-10-CM

## 2023-03-20 ENCOUNTER — Other Ambulatory Visit: Payer: Self-pay | Admitting: Family Medicine

## 2023-03-20 DIAGNOSIS — M5412 Radiculopathy, cervical region: Secondary | ICD-10-CM

## 2023-03-28 ENCOUNTER — Ambulatory Visit (INDEPENDENT_AMBULATORY_CARE_PROVIDER_SITE_OTHER): Payer: Medicare HMO | Admitting: Neurosurgery

## 2023-03-28 ENCOUNTER — Ambulatory Visit
Admission: RE | Admit: 2023-03-28 | Discharge: 2023-03-28 | Disposition: A | Discharge status: Home / Self Care | Attending: Neurosurgery | Admitting: Neurosurgery

## 2023-03-28 ENCOUNTER — Ambulatory Visit
Admission: RE | Admit: 2023-03-28 | Discharge: 2023-03-28 | Disposition: A | Discharge status: Home / Self Care | Source: Ambulatory Visit | Attending: Neurosurgery

## 2023-03-28 VITALS — BP 146/80 | Temp 98.0°F | Ht 64.0 in | Wt 195.0 lb

## 2023-03-28 DIAGNOSIS — L299 Pruritus, unspecified: Secondary | ICD-10-CM

## 2023-03-28 DIAGNOSIS — Z981 Arthrodesis status: Secondary | ICD-10-CM

## 2023-03-28 DIAGNOSIS — M5412 Radiculopathy, cervical region: Secondary | ICD-10-CM

## 2023-03-28 NOTE — Progress Notes (Signed)
   REFERRING PHYSICIAN:  Inc, [facility] [address] [place],  [place] [address]  DOS: 01/07/23 C6-7 ACDF  HISTORY OF PRESENT ILLNESS:  03/28/23 Tami Sanders is a 73 y.o presenting today for 3 month post-op follow up. She continues to have numbness and itching around her incision and chin that is driving her crazy. She is otherwise doing well.   02/19/23 Tami Sanders is status post ACDF. Overall, she is doing  well.  I am very pleased with her improvements.  Her symptoms are much improved.  She still has some mild neck pain and some numbness around her left chin.  PHYSICAL EXAMINATION:  NEUROLOGICAL:  General: In no acute distress.   Awake, alert, oriented to person, place, and time.  Pupils equal round and reactive to light.    Strength: Side Biceps Triceps Deltoid Interossei Grip Wrist Ext. Wrist Flex.  R 5 5 5 5 5 5 5   L 5 4+ 5 5 5 5 5    Incision c/d/I and healing well  Imaging:  03/28/23 cervical xrays Without evidence of hardware malfunction.  Assessment / Plan: Tami Sanders is doing well after recent ACDF for left arm pain and weakness.  Will send her to dermatology for her incisional complaints per Dr. Lucienne Capers recommendation. We will see her back in 6 months with cervical xrays prior or sooner should she have any questions or concerns. She expressed understanding and was in agreement with this plan.   Manning Charity PA-C Dept of Neurosurgery

## 2023-04-22 ENCOUNTER — Encounter: Payer: Self-pay | Admitting: Emergency Medicine

## 2023-04-22 ENCOUNTER — Ambulatory Visit (INDEPENDENT_AMBULATORY_CARE_PROVIDER_SITE_OTHER)

## 2023-04-22 ENCOUNTER — Ambulatory Visit
Admission: EM | Admit: 2023-04-22 | Discharge: 2023-04-22 | Disposition: A | Discharge status: Home / Self Care | Attending: Physician Assistant | Admitting: Physician Assistant

## 2023-04-22 DIAGNOSIS — M25562 Pain in left knee: Secondary | ICD-10-CM

## 2023-04-22 DIAGNOSIS — M25462 Effusion, left knee: Secondary | ICD-10-CM | POA: Diagnosis not present

## 2023-04-22 DIAGNOSIS — M5416 Radiculopathy, lumbar region: Secondary | ICD-10-CM | POA: Diagnosis not present

## 2023-04-22 MED ORDER — MELOXICAM 7.5 MG PO TABS: 7.5000 mg | ORAL_TABLET | Freq: Every day | ORAL | 0 refills | Status: AC

## 2023-04-22 MED ORDER — KETOROLAC TROMETHAMINE 60 MG/2ML IM SOLN
30.0000 mg | Freq: Once | INTRAMUSCULAR | Status: AC
  Administered 2023-04-22: 30 mg via INTRAMUSCULAR

## 2023-04-22 NOTE — ED Triage Notes (Signed)
 Pt presents with left knee pain x 1 month. Pt states she was getting out of the car and heard at "pop". At times the knee gets swollen and the pain radiates to her right thigh.

## 2023-04-22 NOTE — Discharge Instructions (Addendum)
-   X-ray obtained.  I will contact you if the radiologist sees anything other than arthritis. - It seems you have 2 issues going on, 1 involving the knee which could be bursitis or Baker's cyst and the other pinched nerve in your back. - I sent anti-inflammatory medication to the pharmacy and you were given a Toradol  anti-inflammatory injection in the clinic.  You can take Tylenol , elevate extremity and apply ice.  Can also use topical Voltaren gel on knee.  Stretch.  Try the piriformis stretch, patient stretch and sciatic nerve relief stressed.  I think physical therapy will be helpful.  He can follow-up with results physiotherapy in [place] without need of referral.  Please contact [facility] for follow-up regarding 1 month history of left knee and leg pain.

## 2023-04-22 NOTE — ED Provider Notes (Signed)
 [facility]    CSN: [id number] Arrival date & time: 04/22/23  1252      History   Chief Complaint Chief Complaint  Patient presents with   Knee Pain    HPI Tami Sanders is a 73 y.o. female with history of hypertension, emphysema, anxiety, diabetes, tobacco abuse, cervical spinal stenosis status post neck surgery, and hyperlipidemia.   Patient presents today for approximately 1 month history of left knee pain and intermittent swelling.  Patient says symptoms began after she heard her knee "pop" when getting out of a car.  She says it did not really hurt at that time but has gradually increasing discomfort.  Also reports pain of the left buttocks radiating down the posterior left thigh and all the way down the left leg to the top of her left foot for approximately 1 month as well.  Reports increased pain when raising her left leg and bending her knee.  Has taken OTC meds and applied ice which temporarily helps with the swelling but the pain has not really improved.  No significant history of arthritis in affected knee.  Patient reports it does feel a bit weak and like it is going to give out at times but she has not fallen.  Not complaining of any back pain, leg weakness, loss of bowel or bladder control.  No other concerns.  HPI  Past Medical History:  Diagnosis Date   Anxiety    Centrilobular emphysema (HCC)    Depression    Diabetes mellitus without complication (HCC)    GERD (gastroesophageal reflux disease)    Hyperlipidemia    Hypertension    Lipoma of chest wall    Palpitations     Patient Active Problem List   Diagnosis Date Noted   Cervical radiculopathy 01/07/2023   Spinal stenosis in cervical region 01/07/2023   Left arm weakness 01/07/2023   Lipoma of chest wall 10/18/2016   Chest pain 09/20/2016   Palpitations 09/20/2016   Centrilobular emphysema (HCC) 09/20/2016   Essential hypertension 09/20/2016   Smoker 09/20/2016    Past Surgical History:   Procedure Laterality Date   ANTERIOR CERVICAL DECOMP/DISCECTOMY FUSION N/A 01/07/2023   Procedure: C6-7 ANTERIOR CERVICAL DISCECTOMY AND FUSION (FORGE);  Surgeon: Jodeen Munch, MD;  Location: [facility] ORS;  Service: Neurosurgery;  Laterality: N/A;   CARDIAC CATHETERIZATION     COLONOSCOPY     MASS EXCISION Right 10/09/2016   Procedure: EXCISION MASS RIGHT LATERAL CHEST WALL;  Surgeon: Jerlean Mood, MD;  Location: [facility] ORS;  Service: General;  Laterality: Right;    OB History   No obstetric history on file.      Home Medications    Prior to Admission medications   Medication Sig Start Date End Date Taking? Authorizing Provider  meloxicam  (MOBIC ) 7.5 MG tablet Take 1 tablet (7.5 mg total) by mouth daily. 04/22/23 05/22/23 Yes Nancy Axon B, PA-C  amLODipine (NORVASC) 10 MG tablet Take 10 mg by mouth every morning.     [provider]  atorvastatin (LIPITOR) 20 MG tablet Take 20 mg by mouth daily. 03/24/19   [provider]  hydrALAZINE  (APRESOLINE ) 25 MG tablet Take 25 mg by mouth in the morning.    [provider]  JARDIANCE 10 MG TABS tablet Take 10 mg by mouth daily. 04/23/19   [provider]  losartan  (COZAAR ) 100 MG tablet Take 100 mg by mouth daily. 02/03/23   [provider]  metFORMIN (GLUCOPHAGE-XR) 750 MG 24  hr tablet Take 750 mg by mouth in the morning. Take 1 tablet (750 mg) by mouth twice daily    [provider]  methocarbamol  (ROBAXIN ) 500 MG tablet Take 1 tablet (500 mg total) by mouth every 6 (six) hours as needed for muscle spasms. 01/07/23   Noble Bateman, PA  omeprazole (PRILOSEC) 20 MG capsule Take 20 mg by mouth in the morning. 02/18/19   [provider]  potassium chloride  (K-DUR) 10 MEQ tablet Take 1 tablet (10 mEq total) by mouth daily. 04/18/18 12/19/23  Gollan, Timothy J, MD  spironolactone (ALDACTONE) 25 MG tablet Take 25 mg by mouth in the morning. 02/29/20 12/19/23  [provider]     Family History Family History  Problem Relation Age of Onset   Cancer Mother        breast   Breast cancer Mother    Cancer Father        prostate   Cancer Paternal Aunt         breast   Breast cancer Paternal Aunt     Social History Social History   Tobacco Use   Smoking status: Every Day    Current packs/day: 0.25    Average packs/day: 0.3 packs/day for 42.0 years (10.5 ttl pk-yrs)    Types: Cigarettes   Smokeless tobacco: Never   Tobacco comments:    4-5 cigarettes a day  Vaping Use   Vaping status: Never Used  Substance Use Topics   Alcohol use: No   Drug use: No     Allergies   Ace inhibitors   Review of Systems Review of Systems  Musculoskeletal:  Positive for arthralgias and joint swelling. Negative for back pain and gait problem.  Skin:  Negative for color change, rash and wound.  Neurological:  Negative for weakness and numbness.     Physical Exam Triage Vital Signs ED Triage Vitals [04/22/23 1357]  Encounter Vitals Group     BP (!) 183/90     Systolic BP Percentile      Diastolic BP Percentile      Pulse Rate 68     Resp 18     Temp 98.5 F (36.9 C)     Temp Source Oral     SpO2 97 %     Weight      Height      Head Circumference      Peak Flow      Pain Score 7     Pain Loc      Pain Education      Exclude from Growth Chart    No data found.  Updated Vital Signs BP (!) 183/90 (BP Location: Right Arm) Comment: did not take BP meds today  Pulse 68   Temp 98.5 F (36.9 C) (Oral)   Resp 18   SpO2 97%      Physical Exam Vitals and nursing note reviewed.  Constitutional:      General: She is not in acute distress.    Appearance: Normal appearance. She is not ill-appearing or toxic-appearing.  HENT:     Head: Normocephalic and atraumatic.  Eyes:     General: No scleral icterus.       Right eye: No discharge.        Left eye: No discharge.     Conjunctiva/sclera: Conjunctivae normal.  Cardiovascular:     Rate and  Rhythm: Normal rate and regular rhythm.     Heart sounds: Normal heart sounds.  Pulmonary:     Effort: Pulmonary effort is normal. No respiratory distress.     Breath sounds: Normal breath sounds.  Musculoskeletal:     Cervical back: Neck supple.     Comments: No tenderness of back.  There is tenderness to palpation of the left buttocks and left lateral hip.  Positive straight leg raise on left.  Mild to moderate swelling of left anterior knee with tenderness of the lateral knee joint and posterior knee.  Full range of motion but has increased discomfort when trying to flex beyond 90 degrees.  Normal gait and full strength of lower extremity compared to opposite leg.  Skin:    General: Skin is dry.  Neurological:     General: No focal deficit present.     Mental Status: She is alert. Mental status is at baseline.     Motor: No weakness.     Gait: Gait normal.  Psychiatric:        Mood and Affect: Mood normal.        Behavior: Behavior normal.      UC Treatments / Results  Labs (all labs ordered are listed, but only abnormal results are displayed) Labs Reviewed - No data to display  EKG   Radiology No results found.  Procedures Procedures (including critical care time)  Medications Ordered in UC Medications  ketorolac  (TORADOL ) injection 30 mg (30 mg Intramuscular Given 04/22/23 1439)    Initial Impression / Assessment and Plan / UC Course  I have reviewed the triage vital signs and the nursing notes.  Pertinent labs & imaging results that were available during my care of the patient were reviewed by me and considered in my medical decision making (see chart for details).   73 year old female presents for left knee pain and intermittent swelling for the past 1 month.  No history of trauma.  Also reports pain of the left buttocks radiating elevated on the left leg to the dorsal left foot.  No associated back pain.  No red flag signs or symptoms.  Has been taking OTC meds  and applying ice to the knee which temporarily helps.  Blood pressure 183/90.  Other vitals normal and stable.  Patient is on Norvasc, hydralazine , Cozaar  and spironolactone.  Advised to continue taking medications, keep a log of blood pressure and if consistently greater than 140/90 should follow-up with PCP regarding this.  X-ray of left knee obtained.  Wet read without any significant findings other than potentially mild arthritis of knee.  Advised patient she likely has a couple different things going on.  Concern for bursitis and possible Baker's cyst of the left knee as well as arthritic flare.  The pain throughout the left leg seems more consistent with lumbar radiculopathy.  Discussed this with her and reviewed stretches.  Patient was given 30 mg IM ketorolac  in clinic for acute pain relief and advised to take extra strength Tylenol  and follow RICE guidelines.  Prescription written for meloxicam .  Advised her to follow-up with [facility] orthopedic clinic, where she is established, for further evaluation of 1 month history of left knee and leg pain.  Advise she may need further imaging to include possible MRI of knee and/or L-spine.  Patient is understanding and agreeable.  I advised her to contact her if the radiologist sees anything significantly different from my initial findings.  Patient is understanding and agreeable.  X-ray overread without any acute abnormalities. No change to treatment plan.    Final Clinical Impressions(s) /  UC Diagnoses   Final diagnoses:  Acute pain of left knee  Lumbar radiculopathy  Swelling of left knee     Discharge Instructions      - X-ray obtained.  I will contact you if the radiologist sees anything other than arthritis. - It seems you have 2 issues going on, 1 involving the knee which could be bursitis or Baker's cyst and the other pinched nerve in your back. - I sent anti-inflammatory medication to the pharmacy and you were given a Toradol   anti-inflammatory injection in the clinic.  You can take Tylenol , elevate extremity and apply ice.  Can also use topical Voltaren gel on knee.  Stretch.  Try the piriformis stretch, patient stretch and sciatic nerve relief stressed.  I think physical therapy will be helpful.  He can follow-up with results physiotherapy in [place] without need of referral.  Please contact [facility] for follow-up regarding 1 month history of left knee and leg pain.   ED Prescriptions     Medication Sig Dispense Auth. Provider   meloxicam  (MOBIC ) 7.5 MG tablet Take 1 tablet (7.5 mg total) by mouth daily. 30 tablet Floydene Hy, PA-C      I have reviewed the PDMP during this encounter.   Floydene Hy, PA-C 04/22/23 [phone]

## 2023-04-29 ENCOUNTER — Emergency Department
Admission: EM | Admit: 2023-04-29 | Discharge: 2023-04-29 | Disposition: A | Discharge status: Home / Self Care | Attending: Emergency Medicine | Admitting: Emergency Medicine

## 2023-04-29 ENCOUNTER — Other Ambulatory Visit: Payer: Self-pay

## 2023-04-29 DIAGNOSIS — M545 Low back pain, unspecified: Secondary | ICD-10-CM | POA: Diagnosis present

## 2023-04-29 DIAGNOSIS — M5442 Lumbago with sciatica, left side: Secondary | ICD-10-CM | POA: Insufficient documentation

## 2023-04-29 MED ORDER — KETOROLAC TROMETHAMINE 15 MG/ML IJ SOLN
30.0000 mg | Freq: Once | INTRAMUSCULAR | Status: AC
  Administered 2023-04-29: 30 mg via INTRAMUSCULAR
  Filled 2023-04-29: qty 2

## 2023-04-29 MED ORDER — CYCLOBENZAPRINE HCL 5 MG PO TABS: 5.0000 mg | ORAL_TABLET | Freq: Three times a day (TID) | ORAL | 0 refills | Status: DC | PRN

## 2023-04-29 NOTE — ED Provider Notes (Signed)
 [facility] Provider Note    Event Date/Time   First MD Initiated Contact with Patient 04/29/23 0725     (approximate)   History   Leg Pain   HPI  Tami Sanders is a 73 year old female with history of hypertension, cervical radiculopathy presenting to the emergency department for evaluation of leg pain.  Over the past several weeks patient has had pain in her left lower back radiating down her leg.  Reports a history of something similar on her right side a few years ago that resolved after an IM injection.  Was seen in urgent care about a week ago, but reports ongoing symptoms.  Sees Dr. Jeris Montes for cervical radiculopathy, but has not seen him for this complaint.  No bowel or bladder symptoms, fevers, trauma, numbness, tingling, focal weakness.  Has been ambulatory.  Drove herself to the ER.  Reviewed her ER visit from 6-18/2023.  At that time patient presented with low back discomfort without red flag features.  She received 30 mg of IM Toradol  and reports improvement at that time.     Physical Exam   Triage Vital Signs: ED Triage Vitals [04/29/23 0723]  Encounter Vitals Group     BP (!) 178/98     Systolic BP Percentile      Diastolic BP Percentile      Pulse Rate 85     Resp 20     Temp 98 F (36.7 C)     Temp Source Oral     SpO2 100 %     Weight      Height      Head Circumference      Peak Flow      Pain Score 10     Pain Loc      Pain Education      Exclude from Growth Chart     Most recent vital signs: Vitals:   04/29/23 0723  BP: (!) 178/98  Pulse: 85  Resp: 20  Temp: 98 F (36.7 C)  SpO2: 100%    Nursing notes and vital signs reviewed.  General: Adult female, laying in bed, awake and interactive Head: Atraumatic Chest: Symmetric chest rise, no tenderness to palpation.  Cardiac: Regular rhythm and rate.  Respiratory: Lungs clear to auscultation Abdomen: Soft, nondistended. No tenderness to palpation.  Pelvis:  Stable in AP and lateral compression. No tenderness to palpation. MSK: No deformity to bilateral upper and lower extremity. Full range of motion to bilateral upper lower extremity.  2+ DP pulses bilaterally.  Intact sensation throughout the bilateral lower extremities. Back: No midline tenderness Neuro: Alert, oriented. GCS 15.  Skin: No evidence of burns or lacerations.  ED Results / Procedures / Treatments   Labs (all labs ordered are listed, but only abnormal results are displayed) Labs Reviewed - No data to display   EKG EKG independently reviewed interpreted by myself (ER attending) demonstrates:    RADIOLOGY Imaging independently reviewed and interpreted by myself demonstrates:   Formal Radiology Read:  No results found.  PROCEDURES:  Critical Care performed: No  Procedures   MEDICATIONS ORDERED IN ED: Medications  ketorolac  (TORADOL ) 15 MG/ML injection 30 mg (30 mg Intramuscular Given 04/29/23 0741)     IMPRESSION / MDM / ASSESSMENT AND PLAN / ED COURSE  I reviewed the triage vital signs and the nursing notes.  Differential diagnosis includes, but is not limited to, sciatica, no evidence of spinal cord compression, no history of trauma or focal  tenderness suggestive of acute fracture, no evidence of neurovascular compromise  Patient's presentation is most consistent with acute, uncomplicated illness.  73 year old female presenting with low back pain with left-sided sciatica.  No red flag symptoms, no indication for emergent imaging.  Plans to drive home, reports improvement with Toradol  in the past and wishes to receive this again.  Given a dose of IM Toradol  here.  Discussed risk and benefits of alternative medication such as muscle relaxer, patient would like to trial short course of this.  She is comfortable with discharge and outpatient follow-up with neurosurgery who she is already established with.  Strict return precautions provided.  Patient discharged in  stable condition.     FINAL CLINICAL IMPRESSION(S) / ED DIAGNOSES   Final diagnoses:  Acute left-sided low back pain with left-sided sciatica     Rx / DC Orders   ED Discharge Orders          Ordered    cyclobenzaprine (FLEXERIL) 5 MG tablet  3 times daily PRN        04/29/23 0741             Note:  This document was prepared using Dragon voice recognition software and may include unintentional dictation errors.   Claria Crofts, MD 04/29/23 [phone]

## 2023-04-29 NOTE — Discharge Instructions (Addendum)
 You were seen in the emergency department today for evaluation of your back pain. Fortunately, your exam here is reassuring. You can take Tylenol  and Ibuprofen to help with your pain, unless there is another reason you should not take these. You can also try lidocaine  patches that are available over-the-counter.   We will send a prescription for a muscle relaxer called flexeril to your pharmacy. Do not take this with any other muscle relaxing medications (such as methocarbamol /robaxin ). Do not drive or operate machinery when taking this medication. Follow-up with your primary care doctor or Dr. Jeris Montes for further evaluation. Return to the ER for any worsening symptoms including numbness, tingling, weakness, bowel or bladder incontinence, or any other new or concerning symptoms.

## 2023-04-29 NOTE — ED Notes (Signed)
 See triage note  Presents with pain to left leg  States pain radiates thur entire leg and into back  States she was seen at [facility] last week Denies nay recent injury Pain is worse at night

## 2023-04-29 NOTE — ED Triage Notes (Addendum)
 Pt to ED via POV from home. Pt ambulatory to triage. Pt reports left lower back pain w/ radiation down left leg. Pt seen at [facility] in [facility] 4/21 and was given a shot and reports did not help. Pt states has been unable to sleep due to pain. Pt reports takes BP meds but did not take it this am

## 2023-05-09 ENCOUNTER — Ambulatory Visit: Admitting: Orthopedic Surgery

## 2023-07-25 ENCOUNTER — Emergency Department

## 2023-07-25 ENCOUNTER — Emergency Department
Admission: EM | Admit: 2023-07-25 | Discharge: 2023-07-25 | Disposition: A | Discharge status: Home / Self Care | Attending: Emergency Medicine | Admitting: Emergency Medicine

## 2023-07-25 ENCOUNTER — Other Ambulatory Visit: Payer: Self-pay

## 2023-07-25 DIAGNOSIS — I10 Essential (primary) hypertension: Secondary | ICD-10-CM | POA: Insufficient documentation

## 2023-07-25 DIAGNOSIS — E119 Type 2 diabetes mellitus without complications: Secondary | ICD-10-CM | POA: Insufficient documentation

## 2023-07-25 DIAGNOSIS — M25462 Effusion, left knee: Secondary | ICD-10-CM | POA: Diagnosis not present

## 2023-07-25 DIAGNOSIS — M1712 Unilateral primary osteoarthritis, left knee: Secondary | ICD-10-CM | POA: Diagnosis not present

## 2023-07-25 DIAGNOSIS — M79662 Pain in left lower leg: Secondary | ICD-10-CM | POA: Diagnosis present

## 2023-07-25 MED ORDER — MELOXICAM 7.5 MG PO TABS: 7.5000 mg | ORAL_TABLET | Freq: Every day | ORAL | 0 refills | Status: DC

## 2023-07-25 NOTE — Discharge Instructions (Signed)
 Please follow up with orthopedics if not improving over the week.  Take meloxicam  as prescribed. It may take a few days before you notice improvement.   Return to the ER for symptoms that change or worsen if unable to schedule an appointment with primary care or the orthopedic specialist.

## 2023-07-25 NOTE — ED Notes (Signed)
 See triage note  Presents with left knee pain with some swelling  States this has been happening for at least 3 months  denies nay injury  Ambulates well with slight limp No swelling noted at present

## 2023-07-25 NOTE — ED Triage Notes (Signed)
 Pt states pain to L knee/L leg for 3 months. Pt denies injury. Pt denies hx of blood clots. Pt ambulatory with steady gait. NAD noted.

## 2023-07-25 NOTE — ED Provider Notes (Signed)
 [facility] Provider Note    Event Date/Time   First MD Initiated Contact with Patient 07/25/23 [phone]     (approximate)   History   Knee Pain and Leg Pain   HPI  Tami Sanders is a 73 y.o. female  with history of hypertension, emphysema, diabetes, GERD and as listed in EMR presents to the emergency department for treatment and evaluation of pain in the left lower extremity off and on for the past 3 months. She was evaluated at urgent care and diagnosed with arthritis. Frequency of knee swelling and leg pain has increased over the past few days. No alleviating measures prior to arrival.   Physical Exam    Vitals:   07/25/23 0818 07/25/23 1254  BP: (!) 186/96 (!) 178/90  Pulse: 93 87  Resp: 17 16  Temp: 98.2 F (36.8 C)   SpO2: 100% 100%    General: Awake, no distress.  CV:  Good peripheral perfusion.  Resp:  Normal effort.  Abd:  No distention.  Other:  Small joint effusion noted left knee.  Patient is able to demonstrate full range of motion.  He is able to flex at the hip without pain.  No ankle pain or swelling.  DP pulses 2+.  No noted wounds.  Skin is warm and dry   ED Results / Procedures / Treatments   Labs (all labs ordered are listed, but only abnormal results are displayed)  Labs Reviewed - No data to display   EKG  Not indicated   RADIOLOGY  Image and radiology report reviewed and interpreted by me. Radiology report consistent with the same.  Ultrasound of the left lower extremity is negative for DVT.  X-ray image of the left knee shows small joint effusion and mild patellofemoral compartment osteoarthritis.  PROCEDURES:  Critical Care performed: No  Procedures   MEDICATIONS ORDERED IN ED:  Medications - No data to display   IMPRESSION / MDM / ASSESSMENT AND PLAN / ED COURSE   I have reviewed the triage note and vital signs. Vital signs stable.    Differential diagnosis includes, but is not limited to,  joint effusion,   Patient's presentation is most consistent with acute illness / injury with system symptoms.  73 year old female presenting to the emergency department for treatment and evaluation of left knee and left leg pain that has been intermittent for the past 3 months.  See HPI for further details.  On exam, she does appear to have a mild joint effusion over the left knee.  Leg is not swollen today.  Distal pulses are equal.  X-ray of the knee confirms suspected small joint effusion and mild osteoarthritis.  Ultrasound is negative for DVT.  Patient is not currently on any blood thinner and will be prescribed meloxicam  7.5 mg daily for the next couple of weeks.  She was encouraged to see orthopedics if medication is not working for symptoms get worse.      FINAL CLINICAL IMPRESSION(S) / ED DIAGNOSES   Final diagnoses:  Knee effusion, left  Osteoarthritis of left knee, unspecified osteoarthritis type     Rx / DC Orders   ED Discharge Orders          Ordered    meloxicam  (MOBIC ) 7.5 MG tablet  Daily        07/25/23 1244             Note:  This document was prepared using Dragon voice recognition software and may  include unintentional dictation errors.   Herlinda Kirk NOVAK, FNP 07/25/23 1413    Ernest Ronal BRAVO, MD 07/25/23 [phone]

## 2023-07-26 ENCOUNTER — Encounter: Payer: Self-pay | Admitting: Neurosurgery

## 2023-08-01 ENCOUNTER — Other Ambulatory Visit: Payer: Self-pay | Admitting: Orthopedic Surgery

## 2023-08-01 DIAGNOSIS — M1712 Unilateral primary osteoarthritis, left knee: Secondary | ICD-10-CM

## 2023-08-01 DIAGNOSIS — M2352 Chronic instability of knee, left knee: Secondary | ICD-10-CM

## 2023-08-01 DIAGNOSIS — M2392 Unspecified internal derangement of left knee: Secondary | ICD-10-CM

## 2023-08-01 DIAGNOSIS — M25562 Pain in left knee: Secondary | ICD-10-CM

## 2023-08-02 ENCOUNTER — Encounter: Payer: Self-pay | Admitting: Orthopedic Surgery

## 2023-08-05 ENCOUNTER — Ambulatory Visit
Admission: RE | Admit: 2023-08-05 | Discharge: 2023-08-05 | Disposition: A | Discharge status: Home / Self Care | Source: Ambulatory Visit | Attending: Orthopedic Surgery | Admitting: Orthopedic Surgery

## 2023-08-05 DIAGNOSIS — M25562 Pain in left knee: Secondary | ICD-10-CM

## 2023-08-05 DIAGNOSIS — M1712 Unilateral primary osteoarthritis, left knee: Secondary | ICD-10-CM

## 2023-08-05 DIAGNOSIS — M2392 Unspecified internal derangement of left knee: Secondary | ICD-10-CM

## 2023-08-05 DIAGNOSIS — M2352 Chronic instability of knee, left knee: Secondary | ICD-10-CM

## 2023-09-26 ENCOUNTER — Ambulatory Visit: Admitting: Neurosurgery

## 2023-10-10 ENCOUNTER — Encounter: Payer: Self-pay | Admitting: Neurosurgery

## 2023-10-25 ENCOUNTER — Other Ambulatory Visit: Payer: Self-pay | Admitting: Family Medicine

## 2023-10-25 DIAGNOSIS — M5412 Radiculopathy, cervical region: Secondary | ICD-10-CM

## 2023-11-05 ENCOUNTER — Ambulatory Visit

## 2023-11-05 ENCOUNTER — Ambulatory Visit: Admitting: Neurosurgery

## 2023-11-05 VITALS — Wt 190.6 lb

## 2023-11-05 DIAGNOSIS — M5412 Radiculopathy, cervical region: Secondary | ICD-10-CM

## 2023-11-05 DIAGNOSIS — G8929 Other chronic pain: Secondary | ICD-10-CM

## 2023-11-05 DIAGNOSIS — M25512 Pain in left shoulder: Secondary | ICD-10-CM | POA: Diagnosis not present

## 2023-11-05 DIAGNOSIS — Z09 Encounter for follow-up examination after completed treatment for conditions other than malignant neoplasm: Secondary | ICD-10-CM | POA: Diagnosis not present

## 2023-11-05 NOTE — Progress Notes (Signed)
   REFERRING PHYSICIAN:  Inc, [facility] [address] [place],  [place] [address]  DOS: 01/07/23 C6-7 ACDF  HISTORY OF PRESENT ILLNESS:  11/05/2023 She is doing well.  She is having some shoulder pain, but her radicular pain is gone. 03/28/23 Tami Sanders is a 73 y.o presenting today for 3 month post-op follow up. She continues to have numbness and itching around her incision and chin that is driving her crazy. She is otherwise doing well.   02/19/23 Tami Sanders is status post ACDF. Overall, she is doing  well.  I am very pleased with her improvements.  Her symptoms are much improved.  She still has some mild neck pain and some numbness around her left chin.  PHYSICAL EXAMINATION:  NEUROLOGICAL:  General: In no acute distress.   Awake, alert, oriented to person, place, and time.  Pupils equal round and reactive to light.    Strength: Side Biceps Triceps Deltoid Interossei Grip Wrist Ext. Wrist Flex.  R 5 5 5 5 5 5 5   L 5 5 5 5 5 5 5    Incision c/d/I and healing well  Imaging:  03/28/23 cervical xrays Without evidence of hardware malfunction.  X-rays today show healed C6-7 ACDF  Assessment / Plan: Tami Sanders is doing well after ACDF for left arm pain and weakness.  Her fusion has healed on x-rays.  She is having left shoulder pain.  I offered orthopedic referral, but she want to watch it for now.   I will see her back on as-needed basis.   I spent a total of 10 minutes in this patient's care today. This time was spent reviewing pertinent records including imaging studies, obtaining and confirming history, performing a directed evaluation, formulating and discussing my recommendations, and documenting the visit within the medical record.   Tami Sanders Dept of Neurosurgery
